# Patient Record
Sex: Female | Born: 1985 | Hispanic: No | Marital: Married | Smoking: Former smoker
Health system: Southern US, Community
[De-identification: ages and names within clinical notes are randomized; demographics above are authoritative.]

## PROBLEM LIST (undated history)

## (undated) DIAGNOSIS — R102 Pelvic and perineal pain: Principal | ICD-10-CM

## (undated) DIAGNOSIS — G43909 Migraine, unspecified, not intractable, without status migrainosus: Secondary | ICD-10-CM

## (undated) DIAGNOSIS — Z309 Encounter for contraceptive management, unspecified: Principal | ICD-10-CM

## (undated) DIAGNOSIS — N739 Female pelvic inflammatory disease, unspecified: Secondary | ICD-10-CM

## (undated) DIAGNOSIS — Z8041 Family history of malignant neoplasm of ovary: Secondary | ICD-10-CM

## (undated) DIAGNOSIS — D649 Anemia, unspecified: Secondary | ICD-10-CM

## (undated) DIAGNOSIS — N920 Excessive and frequent menstruation with regular cycle: Secondary | ICD-10-CM

## (undated) DIAGNOSIS — Z789 Other specified health status: Secondary | ICD-10-CM

## (undated) DIAGNOSIS — N921 Excessive and frequent menstruation with irregular cycle: Secondary | ICD-10-CM

## (undated) DIAGNOSIS — H409 Unspecified glaucoma: Secondary | ICD-10-CM

## (undated) DIAGNOSIS — N946 Dysmenorrhea, unspecified: Secondary | ICD-10-CM

## (undated) HISTORY — DX: Migraine, unspecified, not intractable, without status migrainosus: G43.909

## (undated) HISTORY — DX: Excessive and frequent menstruation with regular cycle: N92.0

## (undated) HISTORY — DX: Female pelvic inflammatory disease, unspecified: N73.9

## (undated) HISTORY — DX: Dysmenorrhea, unspecified: N94.6

## (undated) HISTORY — DX: Family history of malignant neoplasm of ovary: Z80.41

## (undated) HISTORY — DX: Encounter for contraceptive management, unspecified: Z30.9

## (undated) HISTORY — PX: DILATION AND CURETTAGE OF UTERUS: SHX78

## (undated) HISTORY — DX: Excessive and frequent menstruation with irregular cycle: N92.1

## (undated) HISTORY — DX: Pelvic and perineal pain: R10.2

---

## 2012-01-18 LAB — OB RESULTS CONSOLE RPR: RPR: NONREACTIVE

## 2012-01-18 LAB — OB RESULTS CONSOLE HEPATITIS B SURFACE ANTIGEN: Hepatitis B Surface Ag: NEGATIVE

## 2012-01-18 LAB — OB RESULTS CONSOLE GC/CHLAMYDIA: Chlamydia: NEGATIVE

## 2012-01-18 LAB — OB RESULTS CONSOLE HGB/HCT, BLOOD: Hemoglobin: 14.6 g/dL

## 2012-06-11 LAB — OB RESULTS CONSOLE RPR: RPR: NONREACTIVE

## 2012-07-30 ENCOUNTER — Encounter (HOSPITAL_COMMUNITY): Payer: Self-pay | Admitting: *Deleted

## 2012-07-30 ENCOUNTER — Inpatient Hospital Stay (HOSPITAL_COMMUNITY)
Admission: AD | Admit: 2012-07-30 | Discharge: 2012-08-03 | DRG: 765 | Disposition: A | Discharge status: Home / Self Care | Payer: Medicaid Other | Source: Ambulatory Visit | Attending: Obstetrics & Gynecology | Admitting: Obstetrics & Gynecology

## 2012-07-30 DIAGNOSIS — O3090 Multiple gestation, unspecified, unspecified trimester: Principal | ICD-10-CM | POA: Diagnosis present

## 2012-07-30 DIAGNOSIS — O30049 Twin pregnancy, dichorionic/diamniotic, unspecified trimester: Secondary | ICD-10-CM

## 2012-07-30 DIAGNOSIS — O30009 Twin pregnancy, unspecified number of placenta and unspecified number of amniotic sacs, unspecified trimester: Secondary | ICD-10-CM | POA: Diagnosis present

## 2012-07-30 HISTORY — DX: Other specified health status: Z78.9

## 2012-07-30 LAB — CBC
Hemoglobin: 12.5 g/dL (ref 12.0–15.0)
MCH: 29.3 pg (ref 26.0–34.0)
MCHC: 32.5 g/dL (ref 30.0–36.0)
Platelets: 190 10*3/uL (ref 150–400)
RBC: 4.26 MIL/uL (ref 3.87–5.11)

## 2012-07-30 LAB — OB RESULTS CONSOLE GBS: GBS: NEGATIVE

## 2012-07-30 LAB — GROUP B STREP BY PCR: Group B strep by PCR: NEGATIVE

## 2012-07-30 LAB — TYPE AND SCREEN

## 2012-07-30 MED ORDER — LACTATED RINGERS IV BOLUS (SEPSIS)
1000.0000 mL | Freq: Once | INTRAVENOUS | Status: AC
  Administered 2012-07-30: 1000 mL via INTRAVENOUS

## 2012-07-30 MED ORDER — ZOLPIDEM TARTRATE 5 MG PO TABS: 5.0000 mg | ORAL_TABLET | Freq: Every evening | ORAL | Status: DC | PRN

## 2012-07-30 MED ORDER — ACETAMINOPHEN 325 MG PO TABS: 650.0000 mg | ORAL_TABLET | ORAL | Status: DC | PRN

## 2012-07-30 MED ORDER — LACTATED RINGERS IV SOLN
INTRAVENOUS | Status: DC
  Administered 2012-07-30 – 2012-07-31 (×6): via INTRAVENOUS

## 2012-07-30 MED ORDER — DOCUSATE SODIUM 100 MG PO CAPS: 100.0000 mg | ORAL_CAPSULE | Freq: Every day | ORAL | Status: DC

## 2012-07-30 MED ORDER — PRENATAL MULTIVITAMIN CH
1.0000 | ORAL_TABLET | Freq: Every day | ORAL | Status: DC
  Administered 2012-07-30: 1 via ORAL
  Filled 2012-07-30: qty 1

## 2012-07-30 MED ORDER — CALCIUM CARBONATE ANTACID 500 MG PO CHEW: 2.0000 | CHEWABLE_TABLET | ORAL | Status: DC | PRN

## 2012-07-30 NOTE — H&P (Signed)
Arlyn Bumpus is a 26 y.o. female presenting for Contractions and cervical change. Maternal Medical History:  Reason for admission: Reason for admission: contractions.  Reason for Admission:   nauseaFetal activity: Perceived fetal activity is normal.   Last perceived fetal movement was within the past hour.      OB History    Grav Para Term Preterm Abortions TAB SAB Ect Mult Living   6 4 3  1  1   3      Past Medical History  Diagnosis Date  . No pertinent past medical history    Past Surgical History  Procedure Date  . Dilation and curettage of uterus    Family History: family history is not on file. Social History:  reports that she quit smoking about 7 years ago. Her smoking use included Cigarettes. She has never used smokeless tobacco. She reports that she does not drink alcohol. Her drug history not on file.   Prenatal Transfer Tool  Maternal Diabetes: No Genetic Screening:  Maternal Ultrasounds/Referrals: Di/Di Twins Fetal Ultrasounds or other Referrals:  Other:  Maternal Substance Abuse:  No Significant Maternal Medications:  None Significant Maternal Lab Results:  None Other Comments:  Unable to complete. Records pending from prior provider.   Review of Systems  Constitutional: Negative for fever.  Gastrointestinal: Negative for nausea, vomiting, abdominal pain, diarrhea and constipation.  Genitourinary: Negative for dysuria and urgency.  Neurological: Negative for headaches.      Blood pressure 123/77, pulse 87, temperature 98.1 F (36.7 C), temperature source Oral, resp. rate 18, height 5\' 2"  (1.575 m), weight 83.008 kg (183 lb), unknown if currently breastfeeding. Maternal Exam:  Uterine Assessment: Contraction strength is mild.  Contraction duration is 70 seconds. Contraction frequency is regular.   Abdomen: Patient reports no abdominal tenderness. Introitus: Normal vulva. Normal vagina.  Ferning test: not done.  Nitrazine test: not done.  Pelvis:  adequate for delivery.   Cervix: Cervix evaluated by digital exam.     Fetal Exam Fetal Monitor Review: Mode: ultrasound.   Baseline rate: Baby A, 130, Baby B 135.  Variability: moderate (6-25 bpm).   Pattern: accelerations present.    Fetal State Assessment: Category I - tracings are normal.     Physical Exam  Nursing note and vitals reviewed. Constitutional: She is oriented to person, place, and time. She appears well-developed and well-nourished.  Cardiovascular: Normal rate.   Respiratory: Effort normal.  GI: Soft.  Genitourinary:        Cervix: 5-6/90/-2 Baby A vertex.   Neurological: She is alert and oriented to person, place, and time.  Skin: Skin is warm and dry.    Prenatal labs: PENDING ABO, Rh:   Antibody:   Rubella:   RPR:    HBsAg:    HIV:    GBS:   Pending  Assessment/Plan: 33.6 IUP Di/Di twins Pt had BMZ and Mag on 06/08/12 Minimal cervical change Reassuring M-F status Expectant management Aggressive hydration to see if contractions decrease  Records pending from prior provider  Transfer to labor and delivery if active labor ensues Anticipate NSVB   Tawnya Crook 07/30/2012, 9:21 PM

## 2012-07-31 ENCOUNTER — Encounter (HOSPITAL_COMMUNITY)
Admission: AD | Disposition: A | Discharge status: Home / Self Care | Payer: Self-pay | Source: Ambulatory Visit | Attending: Obstetrics & Gynecology

## 2012-07-31 ENCOUNTER — Encounter (HOSPITAL_COMMUNITY): Payer: Self-pay | Admitting: Anesthesiology

## 2012-07-31 ENCOUNTER — Inpatient Hospital Stay (HOSPITAL_COMMUNITY): Payer: Medicaid Other

## 2012-07-31 ENCOUNTER — Inpatient Hospital Stay (HOSPITAL_COMMUNITY): Payer: Medicaid Other | Admitting: Anesthesiology

## 2012-07-31 ENCOUNTER — Encounter (HOSPITAL_COMMUNITY): Payer: Self-pay | Admitting: Neonatology

## 2012-07-31 DIAGNOSIS — O30049 Twin pregnancy, dichorionic/diamniotic, unspecified trimester: Secondary | ICD-10-CM

## 2012-07-31 DIAGNOSIS — O329XX0 Maternal care for malpresentation of fetus, unspecified, not applicable or unspecified: Secondary | ICD-10-CM

## 2012-07-31 DIAGNOSIS — O3090 Multiple gestation, unspecified, unspecified trimester: Secondary | ICD-10-CM

## 2012-07-31 DIAGNOSIS — O30009 Twin pregnancy, unspecified number of placenta and unspecified number of amniotic sacs, unspecified trimester: Secondary | ICD-10-CM

## 2012-07-31 LAB — ABO/RH: ABO/RH(D): O POS

## 2012-07-31 LAB — RPR: RPR Ser Ql: NONREACTIVE

## 2012-07-31 SURGERY — Surgical Case
Anesthesia: Spinal | Wound class: Clean Contaminated

## 2012-07-31 MED ORDER — NALOXONE HCL 0.4 MG/ML IJ SOLN: 0.4000 mg | INTRAMUSCULAR | Status: DC | PRN

## 2012-07-31 MED ORDER — SIMETHICONE 80 MG PO CHEW
80.0000 mg | CHEWABLE_TABLET | ORAL | Status: DC | PRN
  Administered 2012-08-02: 80 mg via ORAL

## 2012-07-31 MED ORDER — DIBUCAINE 1 % RE OINT: 1.0000 "application " | TOPICAL_OINTMENT | RECTAL | Status: DC | PRN

## 2012-07-31 MED ORDER — CITRIC ACID-SODIUM CITRATE 334-500 MG/5ML PO SOLN
ORAL | Status: AC
  Administered 2012-07-31: 30 mL
  Filled 2012-07-31: qty 15

## 2012-07-31 MED ORDER — FENTANYL CITRATE 0.05 MG/ML IJ SOLN: 25.0000 ug | INTRAMUSCULAR | Status: DC | PRN

## 2012-07-31 MED ORDER — KETOROLAC TROMETHAMINE 30 MG/ML IJ SOLN: 30.0000 mg | Freq: Four times a day (QID) | INTRAMUSCULAR | Status: AC | PRN

## 2012-07-31 MED ORDER — IBUPROFEN 600 MG PO TABS
600.0000 mg | ORAL_TABLET | Freq: Four times a day (QID) | ORAL | Status: DC
  Administered 2012-07-31 – 2012-08-03 (×11): 600 mg via ORAL
  Filled 2012-07-31 (×6): qty 1

## 2012-07-31 MED ORDER — LACTATED RINGERS IV SOLN: INTRAVENOUS | Status: DC

## 2012-07-31 MED ORDER — PHENYLEPHRINE HCL 10 MG/ML IJ SOLN
INTRAMUSCULAR | Status: DC | PRN
  Administered 2012-07-31: 40 ug via INTRAVENOUS
  Administered 2012-07-31 (×2): 80 ug via INTRAVENOUS
  Administered 2012-07-31: 40 ug via INTRAVENOUS
  Administered 2012-07-31: 80 ug via INTRAVENOUS

## 2012-07-31 MED ORDER — DIPHENHYDRAMINE HCL 25 MG PO CAPS
25.0000 mg | ORAL_CAPSULE | ORAL | Status: DC | PRN
  Filled 2012-07-31: qty 1

## 2012-07-31 MED ORDER — TETANUS-DIPHTH-ACELL PERTUSSIS 5-2.5-18.5 LF-MCG/0.5 IM SUSP
0.5000 mL | Freq: Once | INTRAMUSCULAR | Status: AC
  Administered 2012-08-01: 0.5 mL via INTRAMUSCULAR
  Filled 2012-07-31: qty 0.5

## 2012-07-31 MED ORDER — PHENYLEPHRINE 40 MCG/ML (10ML) SYRINGE FOR IV PUSH (FOR BLOOD PRESSURE SUPPORT)
PREFILLED_SYRINGE | INTRAVENOUS | Status: AC
  Filled 2012-07-31: qty 5

## 2012-07-31 MED ORDER — OXYTOCIN 10 UNIT/ML IJ SOLN
INTRAMUSCULAR | Status: AC
  Filled 2012-07-31: qty 4

## 2012-07-31 MED ORDER — KETOROLAC TROMETHAMINE 60 MG/2ML IM SOLN
INTRAMUSCULAR | Status: AC
  Filled 2012-07-31: qty 2

## 2012-07-31 MED ORDER — NALBUPHINE HCL 10 MG/ML IJ SOLN
5.0000 mg | INTRAMUSCULAR | Status: DC | PRN
  Filled 2012-07-31: qty 1

## 2012-07-31 MED ORDER — NALBUPHINE HCL 10 MG/ML IJ SOLN: 5.0000 mg | INTRAMUSCULAR | Status: DC | PRN

## 2012-07-31 MED ORDER — SCOPOLAMINE 1 MG/3DAYS TD PT72
1.0000 | MEDICATED_PATCH | Freq: Once | TRANSDERMAL | Status: AC
  Administered 2012-07-31: 1.5 mg via TRANSDERMAL

## 2012-07-31 MED ORDER — OXYCODONE-ACETAMINOPHEN 5-325 MG PO TABS
1.0000 | ORAL_TABLET | ORAL | Status: DC | PRN
  Administered 2012-08-01 (×2): 1 via ORAL
  Filled 2012-07-31 (×4): qty 1

## 2012-07-31 MED ORDER — OXYTOCIN 10 UNIT/ML IJ SOLN
40.0000 [IU] | INTRAVENOUS | Status: DC | PRN
  Administered 2012-07-31: 40 [IU] via INTRAVENOUS

## 2012-07-31 MED ORDER — IBUPROFEN 600 MG PO TABS
600.0000 mg | ORAL_TABLET | Freq: Four times a day (QID) | ORAL | Status: DC | PRN
  Administered 2012-08-02: 600 mg via ORAL
  Filled 2012-07-31 (×6): qty 1

## 2012-07-31 MED ORDER — 0.9 % SODIUM CHLORIDE (POUR BTL) OPTIME
TOPICAL | Status: DC | PRN
  Administered 2012-07-31: 1000 mL

## 2012-07-31 MED ORDER — MEPERIDINE HCL 25 MG/ML IJ SOLN: 6.2500 mg | INTRAMUSCULAR | Status: DC | PRN

## 2012-07-31 MED ORDER — ONDANSETRON HCL 4 MG/2ML IJ SOLN
INTRAMUSCULAR | Status: DC | PRN
  Administered 2012-07-31: 4 mg via INTRAVENOUS

## 2012-07-31 MED ORDER — KETOROLAC TROMETHAMINE 60 MG/2ML IM SOLN
60.0000 mg | Freq: Once | INTRAMUSCULAR | Status: AC | PRN
  Administered 2012-07-31: 60 mg via INTRAMUSCULAR

## 2012-07-31 MED ORDER — NALOXONE HCL 1 MG/ML IJ SOLN: 1.0000 ug/kg/h | INTRAMUSCULAR | Status: DC | PRN

## 2012-07-31 MED ORDER — CEFAZOLIN SODIUM-DEXTROSE 2-3 GM-% IV SOLR
2.0000 g | INTRAVENOUS | Status: AC
  Administered 2012-07-31: 2 mg via INTRAVENOUS
  Filled 2012-07-31: qty 50

## 2012-07-31 MED ORDER — DIPHENHYDRAMINE HCL 50 MG/ML IJ SOLN: 25.0000 mg | INTRAMUSCULAR | Status: DC | PRN

## 2012-07-31 MED ORDER — MORPHINE SULFATE 0.5 MG/ML IJ SOLN
INTRAMUSCULAR | Status: AC
  Filled 2012-07-31: qty 10

## 2012-07-31 MED ORDER — SODIUM CHLORIDE 0.9 % IJ SOLN: 3.0000 mL | INTRAMUSCULAR | Status: DC | PRN

## 2012-07-31 MED ORDER — MORPHINE SULFATE (PF) 0.5 MG/ML IJ SOLN
INTRAMUSCULAR | Status: DC | PRN
  Administered 2012-07-31: .1 mg via INTRATHECAL

## 2012-07-31 MED ORDER — ONDANSETRON HCL 4 MG PO TABS: 4.0000 mg | ORAL_TABLET | ORAL | Status: DC | PRN

## 2012-07-31 MED ORDER — ZOLPIDEM TARTRATE 5 MG PO TABS: 5.0000 mg | ORAL_TABLET | Freq: Every evening | ORAL | Status: DC | PRN

## 2012-07-31 MED ORDER — ONDANSETRON HCL 4 MG/2ML IJ SOLN: 4.0000 mg | INTRAMUSCULAR | Status: DC | PRN

## 2012-07-31 MED ORDER — FENTANYL CITRATE 0.05 MG/ML IJ SOLN
INTRAMUSCULAR | Status: DC | PRN
  Administered 2012-07-31: 15 ug via INTRATHECAL

## 2012-07-31 MED ORDER — PRENATAL MULTIVITAMIN CH
1.0000 | ORAL_TABLET | Freq: Every day | ORAL | Status: DC
  Administered 2012-08-01 – 2012-08-03 (×3): 1 via ORAL
  Filled 2012-07-31 (×3): qty 1

## 2012-07-31 MED ORDER — FENTANYL CITRATE 0.05 MG/ML IJ SOLN
INTRAMUSCULAR | Status: AC
  Filled 2012-07-31: qty 2

## 2012-07-31 MED ORDER — DIPHENHYDRAMINE HCL 25 MG PO CAPS: 25.0000 mg | ORAL_CAPSULE | Freq: Four times a day (QID) | ORAL | Status: DC | PRN

## 2012-07-31 MED ORDER — BUPIVACAINE IN DEXTROSE 0.75-8.25 % IT SOLN
INTRATHECAL | Status: DC | PRN
  Administered 2012-07-31: 10.5 mg via INTRATHECAL

## 2012-07-31 MED ORDER — OXYTOCIN 40 UNITS IN LACTATED RINGERS INFUSION - SIMPLE MED
62.5000 mL/h | INTRAVENOUS | Status: AC
  Administered 2012-07-31: 62.5 mL/h via INTRAVENOUS
  Filled 2012-07-31: qty 1000

## 2012-07-31 MED ORDER — METOCLOPRAMIDE HCL 5 MG/ML IJ SOLN: 10.0000 mg | Freq: Three times a day (TID) | INTRAMUSCULAR | Status: DC | PRN

## 2012-07-31 MED ORDER — DIPHENHYDRAMINE HCL 50 MG/ML IJ SOLN: 12.5000 mg | INTRAMUSCULAR | Status: DC | PRN

## 2012-07-31 MED ORDER — LANOLIN HYDROUS EX OINT: 1.0000 "application " | TOPICAL_OINTMENT | CUTANEOUS | Status: DC | PRN

## 2012-07-31 MED ORDER — SENNOSIDES-DOCUSATE SODIUM 8.6-50 MG PO TABS
2.0000 | ORAL_TABLET | Freq: Every day | ORAL | Status: DC
  Administered 2012-07-31 – 2012-08-02 (×3): 2 via ORAL

## 2012-07-31 MED ORDER — SIMETHICONE 80 MG PO CHEW
80.0000 mg | CHEWABLE_TABLET | Freq: Three times a day (TID) | ORAL | Status: DC
  Administered 2012-07-31 – 2012-08-02 (×8): 80 mg via ORAL

## 2012-07-31 MED ORDER — ONDANSETRON HCL 4 MG/2ML IJ SOLN
INTRAMUSCULAR | Status: AC
  Filled 2012-07-31: qty 2

## 2012-07-31 MED ORDER — EPHEDRINE 5 MG/ML INJ
INTRAVENOUS | Status: AC
  Filled 2012-07-31: qty 10

## 2012-07-31 MED ORDER — SCOPOLAMINE 1 MG/3DAYS TD PT72
MEDICATED_PATCH | TRANSDERMAL | Status: AC
  Filled 2012-07-31: qty 1

## 2012-07-31 MED ORDER — WITCH HAZEL-GLYCERIN EX PADS: 1.0000 "application " | MEDICATED_PAD | CUTANEOUS | Status: DC | PRN

## 2012-07-31 MED ORDER — MENTHOL 3 MG MT LOZG: 1.0000 | LOZENGE | OROMUCOSAL | Status: DC | PRN

## 2012-07-31 MED ORDER — BUPIVACAINE HCL (PF) 0.5 % IJ SOLN
INTRAMUSCULAR | Status: AC
  Filled 2012-07-31: qty 30

## 2012-07-31 MED ORDER — BUPIVACAINE HCL (PF) 0.5 % IJ SOLN
INTRAMUSCULAR | Status: DC | PRN
  Administered 2012-07-31: 30 mL

## 2012-07-31 MED ORDER — ONDANSETRON HCL 4 MG/2ML IJ SOLN: 4.0000 mg | Freq: Three times a day (TID) | INTRAMUSCULAR | Status: DC | PRN

## 2012-07-31 MED ORDER — EPHEDRINE SULFATE 50 MG/ML IJ SOLN
INTRAMUSCULAR | Status: DC | PRN
  Administered 2012-07-31 (×3): 10 mg via INTRAVENOUS

## 2012-07-31 SURGICAL SUPPLY — 44 items
BENZOIN TINCTURE PRP APPL 2/3 (GAUZE/BANDAGES/DRESSINGS) ×2 IMPLANT
BINDER ABD UNIV 10 28-50 (GAUZE/BANDAGES/DRESSINGS) ×1 IMPLANT
BINDER ABD UNIV 12 45-62 (WOUND CARE) IMPLANT
BINDER ABDOM UNIV 10 (GAUZE/BANDAGES/DRESSINGS) ×2
BINDER ABDOMINAL 46IN 62IN (WOUND CARE)
CLOTH BEACON ORANGE TIMEOUT ST (SAFETY) ×2 IMPLANT
DRESSING TELFA 8X3 (GAUZE/BANDAGES/DRESSINGS) ×2 IMPLANT
DRSG COVADERM 4X10 (GAUZE/BANDAGES/DRESSINGS) ×2 IMPLANT
DURAPREP 26ML APPLICATOR (WOUND CARE) ×2 IMPLANT
ELECT REM PT RETURN 9FT ADLT (ELECTROSURGICAL) ×2
ELECTRODE REM PT RTRN 9FT ADLT (ELECTROSURGICAL) ×1 IMPLANT
EXTRACTOR VACUUM M CUP 4 TUBE (SUCTIONS) IMPLANT
GLOVE BIO SURGEON STRL SZ 6.5 (GLOVE) ×2 IMPLANT
GLOVE BIO SURGEON STRL SZ7 (GLOVE) ×2 IMPLANT
GLOVE BIOGEL PI IND STRL 6.5 (GLOVE) ×1 IMPLANT
GLOVE BIOGEL PI IND STRL 7.0 (GLOVE) ×3 IMPLANT
GLOVE BIOGEL PI INDICATOR 6.5 (GLOVE) ×1
GLOVE BIOGEL PI INDICATOR 7.0 (GLOVE) ×3
GLOVE ECLIPSE 6.0 STRL STRAW (GLOVE) ×2 IMPLANT
GOWN PREVENTION PLUS LG XLONG (DISPOSABLE) ×2 IMPLANT
GOWN STRL REIN XL XLG (GOWN DISPOSABLE) ×4 IMPLANT
KIT ABG SYR 3ML LUER SLIP (SYRINGE) ×4 IMPLANT
NEEDLE HYPO 22GX1.5 SAFETY (NEEDLE) ×2 IMPLANT
NEEDLE HYPO 25X5/8 SAFETYGLIDE (NEEDLE) ×4 IMPLANT
NS IRRIG 1000ML POUR BTL (IV SOLUTION) ×2 IMPLANT
PACK C SECTION WH (CUSTOM PROCEDURE TRAY) ×2 IMPLANT
PAD ABD 7.5X8 STRL (GAUZE/BANDAGES/DRESSINGS) ×4 IMPLANT
PAD OB MATERNITY 4.3X12.25 (PERSONAL CARE ITEMS) IMPLANT
RTRCTR C-SECT PINK 25CM LRG (MISCELLANEOUS) IMPLANT
SLEEVE SCD COMPRESS KNEE MED (MISCELLANEOUS) IMPLANT
SPONGE SURGIFOAM ABS GEL 12-7 (HEMOSTASIS) IMPLANT
STAPLER VISISTAT 35W (STAPLE) IMPLANT
STRIP CLOSURE SKIN 1/2X4 (GAUZE/BANDAGES/DRESSINGS) ×2 IMPLANT
SUT PDS AB 0 CTX 60 (SUTURE) IMPLANT
SUT PLAIN 0 NONE (SUTURE) IMPLANT
SUT SILK 0 TIES 10X30 (SUTURE) IMPLANT
SUT VIC AB 0 CT1 36 (SUTURE) ×6 IMPLANT
SUT VIC AB 3-0 CT1 27 (SUTURE) ×1
SUT VIC AB 3-0 CT1 TAPERPNT 27 (SUTURE) ×1 IMPLANT
SUT VIC AB 4-0 KS 27 (SUTURE) IMPLANT
SYR CONTROL 10ML LL (SYRINGE) ×2 IMPLANT
TOWEL OR 17X24 6PK STRL BLUE (TOWEL DISPOSABLE) ×4 IMPLANT
TRAY FOLEY CATH 14FR (SET/KITS/TRAYS/PACK) ×2 IMPLANT
WATER STERILE IRR 1000ML POUR (IV SOLUTION) ×2 IMPLANT

## 2012-07-31 NOTE — Progress Notes (Addendum)
Patient ID: Norma Vaughn, female   DOB: 01/05/1986, 26 y.o.   MRN: [id number] FACULTY PRACTICE ANTEPARTUM(COMPREHENSIVE) NOTE  Kerington Hildebrant is a 26 y.o. [id number] at [redacted]w[redacted]d by early ultrasound who is admitted for Preterm labor.   Fetal presentation is cephalic. Length of Stay:  1  Days  Subjective: Increasing discomfort Patient reports the fetal movement as active. Patient reports uterine contraction  activity as irregular. Patient reports  vaginal bleeding as none. Patient describes fluid per vagina as Other mucus.  Vitals:  Blood pressure 109/65, pulse 80, temperature 98.5 F (36.9 C), temperature source Oral, resp. rate 18, height 5\' 2"  (1.575 m), weight 183 lb (83.008 kg), SpO2 90.00%. Physical Examination:  General appearance - alert, well appearing, and in no distress Heart - normal rate and regular rhythm Abdomen - soft, nontender, nondistended Fundal Height:  consistent with twins Cervical Exam: Not evaluated.  Extremities: extremities normal, atraumatic, no cyanosis or edema and Homans sign is negative, no sign of DVT with DTRs 2+ bilaterally Membranes:intact  Fetal Monitoring:  Baseline: 140, 150 bpm  Labs:  Recent Results (from the past 24 hour(s))  OB RESULTS CONSOLE GBS      Component Value Range   GBS Negative    RPR   Collection Time   07/30/12  8:55 PM      Component Value Range   RPR NON REACTIVE  NON REACTIVE  TYPE AND SCREEN   Collection Time   07/30/12  8:55 PM      Component Value Range   ABO/RH(D) O POS     Antibody Screen NEG     Sample Expiration 08/02/2012    ABO/RH   Collection Time   07/30/12  8:55 PM      Component Value Range   ABO/RH(D) O POS    CBC   Collection Time   07/30/12  8:55 PM      Component Value Range   WBC 11.7 (*) 4.0 - 10.5 K/uL   RBC 4.26  3.87 - 5.11 MIL/uL   Hemoglobin 12.5  12.0 - 15.0 g/dL   HCT 14.7  82.9 - 56.2 %   MCV 90.4  78.0 - 100.0 fL   MCH 29.3  26.0 - 34.0 pg   MCHC 32.5  30.0 - 36.0 g/dL   RDW  13.0  86.5 - 78.4 %   Platelets 190  150 - 400 K/uL  GROUP B STREP BY PCR   Collection Time   07/30/12 10:40 PM      Component Value Range   Group B strep by PCR NEGATIVE  NEGATIVE    Imaging Studies:      Medications:  Scheduled    . docusate sodium  100 mg Oral Daily  . [COMPLETED] lactated ringers  1,000 mL Intravenous Once  . prenatal multivitamin  1 tablet Oral Daily   I have reviewed the patient's current medications.  ASSESSMENT: Twins 34 weeks preterm labor  PLAN: [place] for presentation and growth Observe for signs of active labor  Winefred Hillesheim 07/31/2012,6:31 AM  US shows A breech on right, B cephalic on left Hadassa Cermak 6:96 AM

## 2012-07-31 NOTE — Progress Notes (Signed)
UR completed 

## 2012-07-31 NOTE — Anesthesia Preprocedure Evaluation (Signed)
Anesthesia Evaluation  Patient identified by MRN, date of birth, ID band Patient awake    Reviewed: Allergy & Precautions, H&P , NPO status , Patient's Chart, lab work & pertinent test results, reviewed documented beta blocker date and time   History of Anesthesia Complications Negative for: history of anesthetic complications  Airway Mallampati: I TM Distance: >3 FB Neck ROM: full    Dental  (+) Teeth Intact   Pulmonary former smoker,  breath sounds clear to auscultation        Cardiovascular negative cardio ROS  Rhythm:regular Rate:Normal     Neuro/Psych negative neurological ROS  negative psych ROS   GI/Hepatic negative GI ROS, Neg liver ROS,   Endo/Other  negative endocrine ROS  Renal/GU negative Renal ROS  negative genitourinary   Musculoskeletal   Abdominal   Peds  Hematology negative hematology ROS (+)   Anesthesia Other Findings NPO since 1 am (ate cake)  Reproductive/Obstetrics (+) Pregnancy (twins, PTL (7-8 cm), baby a breech)                           Anesthesia Physical Anesthesia Plan  ASA: II and emergent  Anesthesia Plan: Spinal   Post-op Pain Management:    Induction:   Airway Management Planned:   Additional Equipment:   Intra-op Plan:   Post-operative Plan:   Informed Consent: I have reviewed the patients History and Physical, chart, labs and discussed the procedure including the risks, benefits and alternatives for the proposed anesthesia with the patient or authorized representative who has indicated his/her understanding and acceptance.     Plan Discussed with: Surgeon and CRNA  Anesthesia Plan Comments:         Anesthesia Quick Evaluation

## 2012-07-31 NOTE — OR Nursing (Signed)
1610 Patient to OR via antenatal staff.

## 2012-07-31 NOTE — Anesthesia Postprocedure Evaluation (Signed)
Anesthesia Post Note  Patient: Norma Vaughn  Procedure(s) Performed: Procedure(s) (LRB): CESAREAN SECTION (N/A)  Anesthesia type: Spinal  Patient location: PACU  Post pain: Pain level controlled  Post assessment: Post-op Vital signs reviewed  Last Vitals:  Filed Vitals:   07/31/12 1030  BP:   Pulse:   Temp: 36.3 C  Resp:     Post vital signs: Reviewed  Level of consciousness: awake  Complications: No apparent anesthesia complications

## 2012-07-31 NOTE — Progress Notes (Signed)
Norma Vaughn is a 26 y.o. [phone] at [redacted]w[redacted]d by ultrasound admitted for Preterm labor  Subjective:Increased contractions   Objective: BP 97/59  Pulse 78  Temp 98 F (36.7 C) (Oral)  Resp 20  Ht 5\' 2"  (1.575 m)  Wt 183 lb (83.008 kg)  BMI 33.47 kg/m2  SpO2 100%      FHT:  FHR: 140,150 bpm, variability: moderate,  accelerations:  Present,  decelerations:  Absent UC:   irregular, every 3-5 minutes SVE:   Dilation: 7.5 Effacement (%): 90 Station: -2 Exam by:: Dr Debroah Loop  Labs: Lab Results  Component Value Date   WBC 11.7* 07/30/2012   HGB 12.5 07/30/2012   HCT 38.5 07/30/2012   MCV 90.4 07/30/2012   PLT 190 07/30/2012    Assessment / Plan: Preterm labor with twins  Labor: cervical change, breech Preeclampsia:  no signs or symptoms of toxicity Fetal Wellbeing:  Category I Pain Control:   I/D:  n/a Anticipated MOD:  Cesarean section offered due to breech presentation twin A. Procedure and the risks of bleeding, infection, bowel and urinary tract damage and anesthesia were discussed and questions were answered  ARNOLD,JAMES 07/31/2012, 7:57 AM

## 2012-07-31 NOTE — Anesthesia Procedure Notes (Signed)
Spinal  Patient location during procedure: OR Start time: 07/31/2012 8:33 AM Staffing Performed by: anesthesiologist  Preanesthetic Checklist Completed: patient identified, site marked, surgical consent, pre-op evaluation, timeout performed, IV checked, risks and benefits discussed and monitors and equipment checked Spinal Block Patient position: sitting Prep: site prepped and draped and DuraPrep Patient monitoring: heart rate, cardiac monitor, continuous pulse ox and blood pressure Approach: midline Location: L3-4 Injection technique: single-shot Needle Needle type: Sprotte  Needle gauge: 24 G Needle length: 9 cm Assessment Sensory level: T4 Additional Notes Clear free flow CSF on first attempt.  No paresthesia.  Patient tolerated procedure well.  Jasmine December, MD

## 2012-07-31 NOTE — H&P (Signed)
Present for exam and I agree with note.  ARNOLD,JAMES 07/31/2012

## 2012-07-31 NOTE — Transfer of Care (Signed)
Immediate Anesthesia Transfer of Care Note  Patient: Norma Vaughn  Procedure(s) Performed: Procedure(s) (LRB) with comments: CESAREAN SECTION (N/A) - primary  Patient Location: PACU  Anesthesia Type:Spinal  Level of Consciousness: awake, alert , oriented and patient cooperative  Airway & Oxygen Therapy: Patient Spontanous Breathing  Post-op Assessment: Report given to PACU RN and Post -op Vital signs reviewed and stable  Post vital signs: Reviewed and stable  Complications: No apparent anesthesia complications

## 2012-07-31 NOTE — Progress Notes (Signed)
07/31/12 1600  Clinical Encounter Type  Visited With Patient and family together (Husband Nedra Hai)  Visit Type Initial;Spiritual support;Social support  Referral From Nurse  Spiritual Encounters  Spiritual Needs Emotional    At nurse Randell Patient introduction, made initial visit with Shizuko and husband Nedra Hai.  Panayiota was talkative and open, taking advantage of opportunity to share and process her birth story and stories about her family (three children at home; good support from family and church).  They welcomed pastoral presence and were both calm and grateful.  Temisha especially is eager to see daughters again in NICU.  Provided pastoral listening, reflection, and affirmation.  Let family know of ongoing chaplain availability through babies' NICU stay.  [address] [place], [place] [phone]

## 2012-07-31 NOTE — Op Note (Signed)
Norma Vaughn  PROCEDURE DATE: 07/31/2012   PREOPERATIVE DIAGNOSIS: Intrauterine twin pregnancy at [redacted]w[redacted]d gestation; spontaneous onset of labor, malpresentation: Twin A breech  POSTOPERATIVE DIAGNOSIS: The same  PROCEDURE: Primary Low Transverse Cesarean Section  SURGEON:  Dr. Eber Jones L. Harraway-Smith  ASSISTANT:  Dr. Camillia Herter. Thad Ranger  INDICATIONS: Norma Vaughn is a 26 y.o.  [phone] at [redacted]w[redacted]d who presented with contractions at 5 cm dilation. At the time of admission, Twin A was vertex. However, at  7 cm dilation, Twin A was found to be breech. Patient was therefore scheduled for immediate cesarean section. Please see preoperative note for further details.  The risks of cesarean section were discussed with the patient including but were not limited to: bleeding which may require transfusion or reoperation; infection which may require antibiotics; injury to bowel, bladder, ureters or other surrounding organs; injury to the fetus; need for additional procedures including hysterectomy in the event of a life-threatening hemorrhage; placental abnormalities wth subsequent pregnancies, incisional problems, thromboembolic phenomenon and other postoperative/anesthesia complications.   The patient concurred with the proposed plan, giving informed written consent for the procedure.    FINDINGS:  Twin A:  Viable female infant in breech presentation.  Apgars 8 and 9; weight 4lb 8.7 oz.  Twin B:  Viable female infant in vertex position, APGARs 8 and 9; weight 4 lb, 5.1 oz. Clear amniotic fluid.  Intact placentas, three vessel cords.  Normal uterus, fallopian tubes and ovaries bilaterally.  ANESTHESIA: Spinal INTRAVENOUS FLUIDS: 3400 ml ESTIMATED BLOOD LOSS: 600 ml URINE OUTPUT:  50 ml SPECIMENS: Placenta sent to pathology COMPLICATIONS: None immediate  PROCEDURE IN DETAIL:  The patient preoperatively received intravenous antibiotics and had sequential compression devices applied to her lower  extremities.  She was then taken to the operating room where spinal anesthesia was administered and was found to be adequate. She was then placed in a dorsal supine position with a leftward tilt, and prepped and draped in a sterile manner.  A foley catheter was placed into her bladder and attached to constant gravity.  After an adequate timeout was performed, a Pfannenstiel skin incision was made with scalpel and carried through to the underlying layer of fascia. The fascia was incised in the midline, and this incision was extended bilaterally using the Mayo scissors.  Kocher clamps were applied to the superior aspect of the fascial incision and the underlying rectus muscles were dissected off bluntly. A similar process was carried out on the inferior aspect of the fascial incision. The rectus muscles were separated in the midline bluntly and the peritoneum was entered bluntly. Attention was turned to the lower uterine segment where a low transverse hysterotomy incision was made with a scalpel and extended bilaterally bluntly.  Twin A was successfully delivered, the cord was clamped and cut and the infant was handed over to awaiting neonatology team. Twin B was then delivered successfully, the cord was clamped and cut and the infant handed off to the neonatology team. Uterine massage was then administered, and the placentas were delivered intact with three-vessel cords. The uterus was then cleared of clot and debris.  The hysterotomy was closed with 0 Vicryl in a running locked fashion, and an imbricating layer was also placed with the same suture. The uterus was returned to the pelvis. The pelvis was cleared of all clot and debris. Hemostasis was confirmed on all surfaces.  The peritoneum and the muscles were reapproximated using 0 Vicryl in 1 interrupted suture. The fascia was then closed using  0 Vicryl in a running fashion.  The subcutaneous layer was irrigated, and the skin was closed with a 4-0 Vicryl  subcuticular stitch. 30 ml of 0.5% marcaine were injected into the subcutaneous tissue around the incision following closure. The patient tolerated the procedure well. Sponge, lap, instrument and needle counts were correct x 2.  She was taken to the recovery room in stable condition.

## 2012-08-01 ENCOUNTER — Encounter (HOSPITAL_COMMUNITY): Payer: Self-pay | Admitting: Obstetrics & Gynecology

## 2012-08-01 LAB — CBC
MCH: 29.3 pg (ref 26.0–34.0)
MCHC: 32.6 g/dL (ref 30.0–36.0)
MCV: 90 fL (ref 78.0–100.0)
Platelets: 168 10*3/uL (ref 150–400)
RDW: 14 % (ref 11.5–15.5)

## 2012-08-01 NOTE — Addendum Note (Signed)
Addendum  created 08/01/12 1340 by Earmon Phoenix, CRNA   Modules edited:Notes Section

## 2012-08-01 NOTE — Anesthesia Postprocedure Evaluation (Signed)
  Anesthesia Post-op Note  Patient: Norma Vaughn  Procedure(s) Performed: Procedure(s) (LRB) with comments: CESAREAN SECTION (N/A) - primary  Patient Location: [facility] Unit  Anesthesia Type:Spinal  Level of Consciousness: awake, alert  and oriented  Airway and Oxygen Therapy: Patient Spontanous Breathing  Post-op Pain: mild  Post-op Assessment: Patient's Cardiovascular Status Stable, Respiratory Function Stable, No headache, No backache, No residual numbness and No residual motor weakness  Post-op Vital Signs: stable  Complications: No apparent anesthesia complications

## 2012-08-01 NOTE — Progress Notes (Signed)
08/01/12 1100  Clinical Encounter Type  Visited With Patient and family together (With husband in NICU)  Visit Type Follow-up;Spiritual support;Social support  Spiritual Encounters  Spiritual Needs Emotional  Stress Factors  Patient Stress Factors Loss of control (Norma Vaughn especially is worried about [name])    Followed up with Norma Vaughn and her husband as she held baby Angelica in the NICU while [name] received care from nurse and specialists.  Norma Vaughn was sad, worried, and exhausted after Melody's setbacks last night and brief, poor sleep.  Husband may need to return to work tomorrow, so Engineer, agricultural and I plan to follow up when she may need extra support.  Provided pastoral listening, emotional support, and witness to their unfolding family story.  [address] [place], [place] [phone]

## 2012-08-02 NOTE — Progress Notes (Signed)
Subjective: Postpartum Day 2: Cesarean Delivery Patient reports incisional pain, tolerating PO, + flatus, + BM and no problems voiding.    Objective: Vital signs in last 24 hours: Temp:  [97.8 F (36.6 C)-98.5 F (36.9 C)] 97.8 F (36.6 C) (12/05 0553) Pulse Rate:  [65-72] 65  (12/05 0553) Resp:  [18] 18  (12/05 0553) BP: (93-107)/(50-54) 93/53 mmHg (12/05 0553) SpO2:  [99 %-100 %] 99 % (12/05 0553)  Physical Exam:  Filed Vitals:   08/02/12 0553  BP: 93/53  Pulse: 65  Temp: 97.8 F (36.6 C)  Resp: 18   General: alert, cooperative and appears stated age CVS:  RRR, without murmur, gallops, or rubs Lungs:  CTA bilat ABD:  +BSx4, normal Lochia: appropriate Uterine Fundus: firm Incision: no significant drainage; incision without signs of infection (no redness, no abnormal discharge, no odor) DVT Evaluation: No evidence of DVT seen on physical exam. Negative Homan's sign.  Basename 08/01/12 0525 07/30/12 2055  HGB 11.4* 12.5  HCT 35.0* 38.5    Assessment/Plan: Status post Cesarean section. Doing well postoperatively.  Continue current care.  [facility] 08/02/2012, 9:48 AM

## 2012-08-03 MED ORDER — OXYCODONE-ACETAMINOPHEN 5-325 MG PO TABS: 1.0000 | ORAL_TABLET | ORAL | Status: DC | PRN

## 2012-08-03 MED ORDER — IBUPROFEN 600 MG PO TABS: 600.0000 mg | ORAL_TABLET | Freq: Four times a day (QID) | ORAL | Status: DC | PRN

## 2012-08-03 NOTE — Progress Notes (Signed)
Discharge instructions provided to patient and significant other at bedside. No questions at this time.  patient discharged home with prescriptions and personal belongings.  Patient left unit in stable condition.  Osvaldo Angst, RN-----------

## 2012-08-03 NOTE — Progress Notes (Signed)
I was referred to pt by Caro Hight who saw her earlier in the week.  Norma Vaughn is concerned over [name]'s conditions, but she continues to take things day by day.  She is working with social work to try to find a way to stay closer to the hospital so that she can visit them more easily and so that she can get here quickly if her babies' conditions change.  She lives with her husband and her other  3 children about 90 minutes from here.    I provided compassionate listening as she told her story and helped her to think through her resources for support during this difficult time.  We will continue to follow this family in the NICU, but please page as needs arise.  [employer] Pager, [phone] 10:41 AM   08/03/12 1000  Clinical Encounter Type  Visited With Patient  Visit Type Follow-up;Spiritual support  Referral From Chaplain  Stress Factors  Patient Stress Factors (Babies in NICU.)

## 2012-08-03 NOTE — Clinical Social Work Psychosocial (Signed)
Clinical Social Work Department PSYCHOSOCIAL ASSESSMENT - MATERNAL/CHILD 08/03/2012  Patient:  Norma Vaughn, Norma Vaughn  Account Number:  [id number]  Admit Date:  07/31/2012  Marjo Bicker Name:   Geri Seminole    Clinical Social Worker:  Vennie Homans, [place]   Date/Time:  08/02/2012 11:00 AM  Date Referred:  08/02/2012   Referral source  NICU     Referred reason  NICU   Other referral source:    I:  FAMILY / HOME ENVIRONMENT Child's legal guardian:  PARENT  Guardian - Name Guardian - Age Guardian - Address  Norma Vaughn 26 [address] [place], [place]  Norma Vaughn  [address] [place], [place]   Other household support members/support persons Name Relationship DOB  Patience Yauco SISTER 6yo  Harmony Blanks SISTER 4yo  Vicente Serene BROTHER 20 months  Selena Lesser IUFD at 25 weeks, 2010   Other support:   Extensive network of very large extended family    II  PSYCHOSOCIAL DATA Information Source:  Family Interview  Surveyor, quantity and [employer] Employment:   MOB stays at home with children  FOB is a Curator and is currently working in [place], [place] Monday-Friday.   Financial resources:  Medicaid If [employer] - County:   Other  [facility]   School / Grade:   Maternity Care Coordinator / Child Services Coordination / Early Interventions:   MOB reports she has [facility] in [facility] where she resides.  Cultural issues impacting care:   None noted    III  STRENGTHS Strengths  Compliance with medical plan  Adequate Resources  Supportive family/friends   Strength comment:  MOB is open to assistance and not afraid to ask for help when needed. VERY large family at home that is helping with siblings.   IV  RISK FACTORS AND CURRENT PROBLEMS Current Problem:  YES   Risk Factor & Current Problem Patient Issue Family Issue Risk Factor / Current Problem Comment  Adjustment to Illness N Y Family stressed about Baby A, Melody  TRANSPORTATION N Y  Distance and gas may be an issue.  Financial Resources N Y Finances are tight.    V  SOCIAL WORK ASSESSMENT CSW met with MOB in her room to complete assessment. She seemed in good spirits currently, but shared she was concerned for the twins. MOB has a huge family support network to assist her. Family is helping with her other children and can assist with transportation to visit. Visiting is a concern, as family lives about an hour and a half a way. Dad works as a Curator, but has had to work out of town for his company. This has been a stressor as well on MOB and children.  Family appears linked with resources in their local county, but finances appear to be tight.      VI SOCIAL WORK PLAN Social Work Plan  Psychosocial Support/Ongoing Assessment of Needs  Information/Referral to [employer]   Type of pt/family education:   If child protective services report - county:   If child protective services report - date:   Information/referral to community resources comment:   May need gas assistance and help with baby items.  Possible [employer] referral   Other social work plan:   CSW offered supportive listening and discussed coping techniques. CSW shared local resources to assist family. CSW looked into [facility] for family, but no rooms are available. Being able to visit regularly is biggest concern to family due to distance and small children  at home. CSW will follow closely and help with resources.  MOB very appreciative.     Doreen Salvage, LCSW  Coverning NICU for [facility]iding M-F 8am-12pm

## 2012-08-03 NOTE — Discharge Summary (Signed)
Attestation of Attending Supervision of Advanced Practitioner (CNM/NP): Evaluation and management procedures were performed by the Advanced Practitioner under my supervision and collaboration.  I have reviewed the Advanced Practitioner's note and chart, and I agree with the management and plan.  Trenna Kiely, MD, FACOG Attending Obstetrician & Gynecologist Faculty Practice, [facility]  

## 2012-08-03 NOTE — Discharge Summary (Signed)
Norma Vaughn, Norma Vaughn Range Status  08/01/2012 35.0* 36.0 - 46.0 % Final  Hospital Course: [place] Pelto is a 26 y.o. [id number] at [redacted]w[redacted]d who presented with contractions at 5 cm dilation. At the time of admission, Twin A was vertex. However, at 7 cm dilation, Twin A was found to be breech. Patient was therefore scheduled for immediate cesarean section. She underwent a LTCS with delivery of twin females who were taken to the NICU.  They have done well, but one is requiring more support than the other.  Parents are coping well and have support.  Physical Exam:  General: alert, cooperative and no distress Lochia: appropriate Uterine Fundus: firm Incision: healing well, no significant drainage DVT Evaluation: No evidence of DVT seen on physical exam.  Discharge Diagnoses: Premature labor and Cesarean Delivery of twin girls  Discharge Information: Date: 08/03/2012 Activity: No driving for 2 wks, pelvic rest, activity as tolerated Diet: routine Medications: PNV, Ibuprofen and Percocet Condition: stable and improved Instructions: refer to practice specific booklet Discharge to: home Follow-up Information    Follow up with [facility].   Contact information:   [address] [place] [address] [phone]       in one week.  Newborn Data:   Tinya, Cadogan [[id number]]  Live born female  Birth Weight: 4 lb 8.7 oz (2060 g) APGAR: 8, [address]   Rifky, Lapre [[id number]]  Live born female  Birth Weight: 4 lb 5.1 oz (1960 g) APGAR: 8, 9  Infants both in NICU .  [facility] 08/03/2012, 10:17 AM

## 2012-08-08 NOTE — Op Note (Signed)
I was scrubbed for entire procedure.  There were no immediate complications.  Takiah Maiden L. Harraway-Smith, M.D., Evern Core

## 2013-05-01 ENCOUNTER — Telehealth: Payer: Self-pay | Admitting: Obstetrics & Gynecology

## 2013-05-01 NOTE — Telephone Encounter (Signed)
Spoke with pt. Had twins in 07/2012 by C section. No period while breastfeeding, but she is not breastfeeding now and period is late. Pt states home pregnancy test is negative but she has been pregnant before with home pregnancy test showing negative. Pt states also having pain, wonders if she has a cyst on ovary. Appt scheduled and will do pregnancy test and evaluate pain. JSY

## 2013-05-09 ENCOUNTER — Ambulatory Visit: Payer: Self-pay | Admitting: Adult Health

## 2013-09-16 ENCOUNTER — Other Ambulatory Visit: Payer: Self-pay | Admitting: Obstetrics & Gynecology

## 2013-09-16 DIAGNOSIS — O09299 Supervision of pregnancy with other poor reproductive or obstetric history, unspecified trimester: Secondary | ICD-10-CM

## 2013-09-16 DIAGNOSIS — O34219 Maternal care for unspecified type scar from previous cesarean delivery: Secondary | ICD-10-CM

## 2013-09-16 DIAGNOSIS — O3680X0 Pregnancy with inconclusive fetal viability, not applicable or unspecified: Secondary | ICD-10-CM

## 2013-09-17 ENCOUNTER — Other Ambulatory Visit: Payer: Self-pay | Admitting: Obstetrics & Gynecology

## 2013-09-17 ENCOUNTER — Ambulatory Visit (INDEPENDENT_AMBULATORY_CARE_PROVIDER_SITE_OTHER): Payer: Medicaid Other

## 2013-09-17 ENCOUNTER — Encounter (INDEPENDENT_AMBULATORY_CARE_PROVIDER_SITE_OTHER): Payer: Self-pay

## 2013-09-17 ENCOUNTER — Encounter: Payer: Self-pay | Admitting: Obstetrics & Gynecology

## 2013-09-17 DIAGNOSIS — O09299 Supervision of pregnancy with other poor reproductive or obstetric history, unspecified trimester: Secondary | ICD-10-CM

## 2013-09-17 DIAGNOSIS — O26849 Uterine size-date discrepancy, unspecified trimester: Secondary | ICD-10-CM

## 2013-09-17 DIAGNOSIS — O34219 Maternal care for unspecified type scar from previous cesarean delivery: Secondary | ICD-10-CM

## 2013-09-17 DIAGNOSIS — O3680X0 Pregnancy with inconclusive fetal viability, not applicable or unspecified: Secondary | ICD-10-CM

## 2013-09-17 NOTE — Progress Notes (Signed)
U/S-transvaginal u/s performed, single GS noted with +YS noted, GS meas c/w 5+4 wks no embryo noted yet, cx appears closed, will repeat u/s for viability

## 2013-09-20 ENCOUNTER — Encounter: Payer: Self-pay | Admitting: Obstetrics and Gynecology

## 2013-09-27 ENCOUNTER — Other Ambulatory Visit: Payer: Self-pay | Admitting: Obstetrics & Gynecology

## 2013-09-27 DIAGNOSIS — O3680X0 Pregnancy with inconclusive fetal viability, not applicable or unspecified: Secondary | ICD-10-CM

## 2013-09-30 ENCOUNTER — Encounter: Payer: Self-pay | Admitting: Women's Health

## 2013-09-30 ENCOUNTER — Ambulatory Visit (INDEPENDENT_AMBULATORY_CARE_PROVIDER_SITE_OTHER): Payer: Medicaid Other | Admitting: Women's Health

## 2013-09-30 ENCOUNTER — Ambulatory Visit (INDEPENDENT_AMBULATORY_CARE_PROVIDER_SITE_OTHER): Payer: Medicaid Other

## 2013-09-30 ENCOUNTER — Other Ambulatory Visit: Payer: Self-pay | Admitting: Obstetrics & Gynecology

## 2013-09-30 DIAGNOSIS — O200 Threatened abortion: Secondary | ICD-10-CM

## 2013-09-30 DIAGNOSIS — O3680X0 Pregnancy with inconclusive fetal viability, not applicable or unspecified: Secondary | ICD-10-CM

## 2013-09-30 NOTE — Progress Notes (Addendum)
   [facility] Visit  Patient name: Norma BucksDanielle Meader MRN [id number]  Date of birth: [birth year]  CC & HPI:  Norma Vaughn is a 28 y.o. Caucasian [id number] female at 2329w3d by LMP originally scheduled today for initial pnv. However, she has been having spotting and cramping x ~2wks, heavier bleeding on 3d ago, back to spotting next day, then heavier again yesterday. She had an u/s 2wks ago w/ single GS measuring 4931w4d, w/ +YS, but no embryo yet. Has had 2 previous sab's, fairly certain she is miscarrying. She is accompanied today by her husband and set of 6081yr old twins.    OB History  Gravida Para Term Preterm AB SAB TAB Ectopic Multiple Living  8 5 3 2 2 2   1 5     # Outcome Date GA Lbr Len/2nd Weight Sex Delivery Anes PTL Lv  8 CUR           7 SAB 05/2013          6A PRE 07/31/12 3349w0d  4 lb 8.7 oz (2.06 kg) F LTCS Spinal  Y  6B  07/31/12 349w0d   F LTCS Spinal  Y  5 TRM 11/17/10    M SVD   Y  4 SAB 02/11/10 6844w0d            Comments: twins  3 PRE 06/26/09 53102w5d   M SVD   SB  2 TRM 11/02/07    F SVD   Y  1 TRM 12/04/05    F SVD   Y      Pertinent History Reviewed:  Medical & Surgical Hx:   Past Medical History  Diagnosis Date  . No pertinent past medical history    Past Surgical History  Procedure Laterality Date  . Dilation and curettage of uterus    . Cesarean section  07/31/2012    Procedure: CESAREAN SECTION;  Surgeon: Willodean Rosenthalarolyn Harraway-Smith, MD;  Location: [facility];  Service: Gynecology;  Laterality: N/A;  primary   Medications: Reviewed & Updated - see associated section Social History: Reviewed -  reports that she quit smoking about 8 years ago. Her smoking use included Cigarettes. She smoked 0.00 packs per day. She has never used smokeless tobacco.  Objective Findings:  Vitals: LMP 07/31/2013  Physical Examination: General appearance - alert, well-appearing, sad  Today's u/s:  single Intrauterine Gestational Sac noted, GS meas c/w 6+2wks, no embryo noted on  today's exam, cx appears closed, bilateral adnexa appears wnl, GS measurement=15.542mm today  Assessment & Plan:  A:   Threatened miscarriage @ 6729w3d  P:  BHCG, group & type, progesterone today, repeat bhcg in 2d  Repeat u/s 1wk w/ f/u appt with me   Reviewed warning s/s to report  Printed info on threatened miscarriage given  Marge DuncansBooker, Karam Dunson Randall CNM, [employer]NP-BC 09/30/2013 5:05 PM

## 2013-09-30 NOTE — Patient Instructions (Signed)
Threatened Miscarriage  Bleeding during the first 20 weeks of pregnancy is common. This is sometimes called a threatened miscarriage. This is a pregnancy that is threatening to end before the twentieth week of pregnancy. Often this bleeding stops with bed rest or decreased activities as suggested by your caregiver and the pregnancy continues without any more problems. You may be asked to not have sexual intercourse, have orgasms or use tampons until further notice. Sometimes a threatened miscarriage can progress to a complete or incomplete miscarriage. This may or may not require further treatment. Some miscarriages occur before a woman misses a menstrual period and knows she is pregnant.  Miscarriages occur in 15 to 20% of all pregnancies and usually occur during the first 13 weeks of the pregnancy. The exact cause of a miscarriage is usually never known. A miscarriage is natures way of ending a pregnancy that is abnormal or would not make it to term. There are some things that may put you at risk to have a miscarriage, such as:  · Hormone problems.  · Infection of the uterus or cervix.  · Chronic illness, diabetes for example, especially if it is not controlled.  · Abnormal shaped uterus.  · Fibroids in the uterus.  · Incompetent cervix (the cervix is too weak to hold the baby).  · Smoking.  · Drinking too much alcohol. It's best not to drink any alcohol when you are pregnant.  · Taking illegal drugs.  TREATMENT   When a miscarriage becomes complete and all products of conception (all the tissue in the uterus) have been passed, often no treatment is needed. If you think you passed tissue, save it in a container and take it to your doctor for evaluation. If the miscarriage is incomplete (parts of the fetus or placenta remain in the uterus), further treatment may be needed. The most common reason for further treatment is continued bleeding (hemorrhage) because pregnancy tissue did not pass out of the uterus. This  often occurs if a miscarriage is incomplete. Tissue left behind may also become infected. Treatment usually is dilatation and curettage (the removal of the remaining products of pregnancy. This can be done by a simple sucking procedure (suction curettage) or a simple scraping of the inside of the uterus. This may be done in the hospital or in the caregiver's office. This is only done when your caregiver knows that there is no chance for the pregnancy to proceed to term. This is determined by physical examination, negative pregnancy test, falling pregnancy hormone count and/or, an ultrasound revealing a dead fetus.  Miscarriages are often a very emotional time for prospective mothers and fathers. This is not you or your partners fault. It did not occur because of an inadequacy in you or your partner. Nearly all miscarriages occur because the pregnancy has started off wrongly. At least half of these pregnancies have a chromosomal abnormality. It is almost always not inherited. Others may have developmental problems with the fetus or placenta. This does not always show up even when the products miscarried are studied under the microscope. The miscarriage is nearly always not your fault and it is not likely that you could have prevented it from happening. If you are having emotional and grieving problems, talk to your health care provider and even seek counseling, if necessary, before getting pregnant again. You can begin trying for another pregnancy as soon as your caregiver says it is OK.  HOME CARE INSTRUCTIONS   · Your caregiver may order   bed rest depending on how much bleeding and cramping you are having. You may be limited to only getting up to go to the bathroom. You may be allowed to continue light activity. You may need to make arrangements for the care of your other children and for any other responsibilities.  · Keep track of the number of pads you use each day, how often you have to change pads and how  saturated (soaked) they are. Record this information.  · DO NOT USE TAMPONS. Do not douche, have sexual intercourse or orgasms until approved by your caregiver.  · You may receive a follow up appointment for re-evaluation of your pregnancy and a repeat blood test. Re-evaluation often occurs after 2 days and again in 4 to 6 weeks. It is very important that you follow-up in the recommended time period.  · If you are Rh negative and the father is Rh positive or you do not know the fathers' blood type, you may receive a shot (Rh immune globulin) to help prevent abnormal antibodies that can develop and affect the baby in any future pregnancies.  SEEK IMMEDIATE MEDICAL CARE IF:  · You have severe cramps in your stomach, back, or abdomen.  · You have a sudden onset of severe pain in the lower part of your abdomen.  · You develop chills.  · You run an unexplained temperature of 101° F (38.3° C) or higher.  · You pass large clots or tissue. Save any tissue for your caregiver to inspect.  · Your bleeding increases or you become light-headed, weak, or have fainting episodes.  · You have a gush of fluid from your vagina.  · You pass out. This could mean you have a tubal (ectopic) pregnancy.  Document Released: 08/15/2005 Document Revised: 11/07/2011 Document Reviewed: 03/31/2008  ExitCare® Patient Information ©2014 [employer].

## 2013-09-30 NOTE — Progress Notes (Signed)
U/S(7+3wks)-single Intrauterine Gestational Sac noted, GS meas c/w 6+2wks, no embryo noted on today's exam, cx appears closed, bilateral adnexa appears wnl, GS measurement=15.142mm today

## 2013-10-01 LAB — ABO AND RH: RH TYPE: POSITIVE

## 2013-10-01 LAB — PROGESTERONE: PROGESTERONE: 4.6 ng/mL

## 2013-10-01 LAB — HCG, QUANTITATIVE, PREGNANCY: HCG, BETA CHAIN, QUANT, S: 15893 m[IU]/mL

## 2013-10-02 ENCOUNTER — Other Ambulatory Visit: Payer: Self-pay

## 2013-10-03 ENCOUNTER — Ambulatory Visit: Payer: Self-pay | Admitting: Advanced Practice Midwife

## 2013-10-03 ENCOUNTER — Other Ambulatory Visit: Payer: Self-pay | Admitting: Obstetrics & Gynecology

## 2013-10-03 DIAGNOSIS — O200 Threatened abortion: Secondary | ICD-10-CM

## 2013-10-07 ENCOUNTER — Telehealth: Payer: Self-pay | Admitting: *Deleted

## 2013-10-07 ENCOUNTER — Ambulatory Visit: Payer: Self-pay | Admitting: Women's Health

## 2013-10-07 ENCOUNTER — Other Ambulatory Visit: Payer: Self-pay

## 2013-10-07 NOTE — Telephone Encounter (Signed)
Pt states saw Joellyn HaffKim Booker, CNM last week, unable to find heartbeat, threatened missed AB. Now passing tissue and blood clots with cramping, cramping relieved with ibuprofen. Offered pt an appt today but states unable to come due to child in hospital. Pt has an appt for this Thursday. Pt encouraged to call office back with increase pain. Pt verbalized understanding.

## 2013-10-10 ENCOUNTER — Other Ambulatory Visit: Payer: Self-pay

## 2013-10-10 ENCOUNTER — Ambulatory Visit: Payer: Self-pay | Admitting: Advanced Practice Midwife

## 2013-10-23 ENCOUNTER — Ambulatory Visit: Payer: Self-pay | Admitting: Advanced Practice Midwife

## 2013-10-23 ENCOUNTER — Other Ambulatory Visit: Payer: Self-pay

## 2013-10-31 ENCOUNTER — Other Ambulatory Visit: Payer: Self-pay | Admitting: Adult Health

## 2013-10-31 ENCOUNTER — Other Ambulatory Visit: Payer: Self-pay | Admitting: Obstetrics & Gynecology

## 2013-10-31 ENCOUNTER — Encounter: Payer: Self-pay | Admitting: Adult Health

## 2013-10-31 ENCOUNTER — Ambulatory Visit (INDEPENDENT_AMBULATORY_CARE_PROVIDER_SITE_OTHER): Payer: Medicaid Other | Admitting: Adult Health

## 2013-10-31 ENCOUNTER — Ambulatory Visit (INDEPENDENT_AMBULATORY_CARE_PROVIDER_SITE_OTHER): Payer: Medicaid Other

## 2013-10-31 VITALS — BP 98/50 | Ht 62.0 in | Wt 150.0 lb

## 2013-10-31 DIAGNOSIS — Z8759 Personal history of other complications of pregnancy, childbirth and the puerperium: Secondary | ICD-10-CM

## 2013-10-31 DIAGNOSIS — O039 Complete or unspecified spontaneous abortion without complication: Secondary | ICD-10-CM

## 2013-10-31 DIAGNOSIS — N949 Unspecified condition associated with female genital organs and menstrual cycle: Secondary | ICD-10-CM

## 2013-10-31 DIAGNOSIS — Z8742 Personal history of other diseases of the female genital tract: Secondary | ICD-10-CM

## 2013-10-31 DIAGNOSIS — O200 Threatened abortion: Secondary | ICD-10-CM

## 2013-10-31 NOTE — Progress Notes (Signed)
Subjective:     Patient ID: Norma Vaughn, female   DOB: March 29, 1986, 28 y.o.   MRN: [id number]  HPI Norma Vaughn is a 28 year old white female in for US, she had bleeding and passed tissue 2/6-2/9,  she brought it  in today.She bleed til last week, none now.  Review of Systems See HPI Reviewed past medical,surgical, social and family history. Reviewed medications and allergies.     Objective:   Physical Exam BP 98/50  Ht 5\' 2"  (1.575 m)  Wt 150 lb (68.04 kg)  BMI 27.43 kg/m2  LMP 07/31/2013  Breastfeeding? No    US showed endometrium of 7.7 no sac seen,miscarrriage completed, blood type 0+ Discussed birth control, pr not sure what she wants, but does not want more children.  Assessment:     Miscarriage    Plan:     [facility] in 1 week to discuss options of birth control Review handouts on nexplanon, tubal ligation and endo ablation

## 2013-10-31 NOTE — Patient Instructions (Signed)
Miscarriage A miscarriage is the sudden loss of an unborn baby (fetus) before the 20th week of pregnancy. Most miscarriages happen in the first 3 months of pregnancy. Sometimes, it happens before a woman even knows she is pregnant. A miscarriage is also called a "spontaneous miscarriage" or "early pregnancy loss." Having a miscarriage can be an emotional experience. Talk with your caregiver about any questions you may have about miscarrying, the grieving process, and your future pregnancy plans. CAUSES   Problems with the fetal chromosomes that make it impossible for the baby to develop normally. Problems with the baby's genes or chromosomes are most often the result of errors that occur, by chance, as the embryo divides and grows. The problems are not inherited from the parents.  Infection of the cervix or uterus.   Hormone problems.   Problems with the cervix, such as having an incompetent cervix. This is when the tissue in the cervix is not strong enough to hold the pregnancy.   Problems with the uterus, such as an abnormally shaped uterus, uterine fibroids, or congenital abnormalities.   Certain medical conditions.   Smoking, drinking alcohol, or taking illegal drugs.   Trauma.  Often, the cause of a miscarriage is unknown.  SYMPTOMS   Vaginal bleeding or spotting, with or without cramps or pain.  Pain or cramping in the abdomen or lower back.  Passing fluid, tissue, or blood clots from the vagina. DIAGNOSIS  Your caregiver will perform a physical exam. You may also have an ultrasound to confirm the miscarriage. Blood or urine tests may also be ordered. TREATMENT   Sometimes, treatment is not necessary if you naturally pass all the fetal tissue that was in the uterus. If some of the fetus or placenta remains in the body (incomplete miscarriage), tissue left behind may become infected and must be removed. Usually, a dilation and curettage (D and C) procedure is performed.  During a D and C procedure, the cervix is widened (dilated) and any remaining fetal or placental tissue is gently removed from the uterus.  Antibiotic medicines are prescribed if there is an infection. Other medicines may be given to reduce the size of the uterus (contract) if there is a lot of bleeding.  If you have Rh negative blood and your baby was Rh positive, you will need a Rh immunoglobulin shot. This shot will protect any future baby from having Rh blood problems in future pregnancies. HOME CARE INSTRUCTIONS   Your caregiver may order bed rest or may allow you to continue light activity. Resume activity as directed by your caregiver.  Have someone help with home and family responsibilities during this time.   Keep track of the number of sanitary pads you use each day and how soaked (saturated) they are. Write down this information.   Do not use tampons. Do not douche or have sexual intercourse until approved by your caregiver.   Only take over-the-counter or prescription medicines for pain or discomfort as directed by your caregiver.   Do not take aspirin. Aspirin can cause bleeding.   Keep all follow-up appointments with your caregiver.   If you or your partner have problems with grieving, talk to your caregiver or seek counseling to help cope with the pregnancy loss. Allow enough time to grieve before trying to get pregnant again.  SEEK IMMEDIATE MEDICAL CARE IF:   You have severe cramps or pain in your back or abdomen.  You have a fever.  You pass large blood clots (walnut-sized   or larger) ortissue from your vagina. Save any tissue for your caregiver to inspect.   Your bleeding increases.   You have a thick, bad-smelling vaginal discharge.  You become lightheaded, weak, or you faint.   You have chills.  MAKE SURE YOU:  Understand these instructions.  Will watch your condition.  Will get help right away if you are not doing well or get  worse. Document Released: 02/08/2001 Document Revised: 12/10/2012 Document Reviewed: 10/04/2011 [facility] Patient Information 2014 [employer], [place]. Return in 1 week

## 2013-11-01 ENCOUNTER — Telehealth: Payer: Self-pay | Admitting: Adult Health

## 2013-11-01 LAB — HCG, QUANTITATIVE, PREGNANCY

## 2013-11-01 NOTE — Telephone Encounter (Signed)
Number not in service.

## 2013-11-07 ENCOUNTER — Ambulatory Visit (INDEPENDENT_AMBULATORY_CARE_PROVIDER_SITE_OTHER): Payer: Medicaid Other | Admitting: Adult Health

## 2013-11-07 ENCOUNTER — Encounter: Payer: Self-pay | Admitting: Adult Health

## 2013-11-07 VITALS — BP 100/60 | Ht 62.5 in | Wt 150.0 lb

## 2013-11-07 DIAGNOSIS — Z3049 Encounter for surveillance of other contraceptives: Secondary | ICD-10-CM

## 2013-11-07 DIAGNOSIS — Z309 Encounter for contraceptive management, unspecified: Secondary | ICD-10-CM

## 2013-11-07 HISTORY — DX: Encounter for contraceptive management, unspecified: Z30.9

## 2013-11-07 MED ORDER — ETONOGESTREL-ETHINYL ESTRADIOL 0.12-0.015 MG/24HR VA RING: VAGINAL_RING | VAGINAL | Status: DC

## 2013-11-07 NOTE — Progress Notes (Signed)
Subjective:     Patient ID: Norma Vaughn, female   DOB: Dec 17, 1985, 28 y.o.   MRN: [id number]  HPI Norma Vaughn is back to discuss birth control, she and husband reviewed the handouts, and she wants to try nuva ring, has used before.  Review of Systems See HPI Reviewed past medical,surgical, social and family history. Reviewed medications and allergies.     Objective:   Physical Exam BP 100/60  Ht 5' 2.5" (1.588 m)  Wt 150 lb (68.04 kg)  BMI 26.98 kg/m2  LMP 07/31/2013  Breastfeeding? No   Talk only, wants to try nuva ring, has had sex with condom, pt aware that tissue passed was POC.  Assessment:     Contraceptive management Sp miscarriage    Plan:     Rx nuva ring disp 1 ring with 11 refills Put ring in with period Use condoms Return 3/26 for pap Then in 1 year Review handout on nuva ring

## 2013-11-07 NOTE — Patient Instructions (Addendum)

## 2013-11-19 ENCOUNTER — Other Ambulatory Visit: Payer: Medicaid Other | Admitting: Adult Health

## 2013-11-21 ENCOUNTER — Encounter: Payer: Self-pay | Admitting: Adult Health

## 2013-11-21 ENCOUNTER — Other Ambulatory Visit (HOSPITAL_COMMUNITY)
Admission: RE | Admit: 2013-11-21 | Discharge: 2013-11-21 | Disposition: A | Discharge status: Home / Self Care | Payer: Medicaid Other | Source: Ambulatory Visit | Attending: Obstetrics and Gynecology | Admitting: Obstetrics and Gynecology

## 2013-11-21 ENCOUNTER — Ambulatory Visit (INDEPENDENT_AMBULATORY_CARE_PROVIDER_SITE_OTHER): Payer: Medicaid Other | Admitting: Adult Health

## 2013-11-21 VITALS — BP 118/76 | HR 80 | Ht 62.5 in | Wt 148.0 lb

## 2013-11-21 DIAGNOSIS — Z01419 Encounter for gynecological examination (general) (routine) without abnormal findings: Secondary | ICD-10-CM | POA: Insufficient documentation

## 2013-11-21 DIAGNOSIS — Z0000 Encounter for general adult medical examination without abnormal findings: Secondary | ICD-10-CM

## 2013-11-21 NOTE — Patient Instructions (Signed)
Continue nuva ring Physical in 1 year

## 2013-11-21 NOTE — Progress Notes (Signed)
Patient ID: Norma Vaughn, female   DOB: [birth year], 28 y.o.   MRN: [id number] History of Present Illness: [name] is a 28 year old white female, sp recent miscarriage, in for pap and physical.   Current Medications, Allergies, Past Medical History, Past Surgical History, Family History and Social History were reviewed in [facility] electronic medical record.     Review of Systems: Patient denies any headaches, blurred vision, shortness of breath, chest pain, abdominal pain, problems with bowel movements, urination, or intercourse. No joint pain or mood swings, is using nuva ring and last period was a little heavy.    Physical Exam:BP 118/76  Pulse 80  Ht 5' 2.5" (1.588 m)  Wt 148 lb (67.132 kg)  BMI 26.62 kg/m2  LMP 11/11/2013  Breastfeeding? No General:  Well developed, well nourished, no acute distress Skin:  Warm and dry Neck:  Midline trachea, normal thyroid Lungs; Clear to auscultation bilaterally Breast:  No dominant palpable mass, retraction, or nipple discharge Cardiovascular: Regular rate and rhythm Abdomen:  Soft, non tender, no hepatosplenomegaly Pelvic:  External genitalia is normal in appearance.  The vagina is normal in appearance. The cervix is bulbous. Pap performed.Nuva ring in place. Uterus is felt to be normal size, shape, and contour.  No adnexal masses or tenderness noted. Extremities:  No swelling or varicosities noted Psych:  No mood changes, alert and cooperative, seems happy, has 5 children with her.   Impression: Yearly gyn exam    Plan: Continue Nuva Ring(has RX) Physical in 1 year Call prn

## 2013-12-02 ENCOUNTER — Telehealth: Payer: Self-pay | Admitting: Adult Health

## 2013-12-02 NOTE — Telephone Encounter (Signed)
Take ring out for 1 week then put new ring in use condoms

## 2013-12-02 NOTE — Telephone Encounter (Signed)
Pt is concerned about bleeding on the Nuva ring, states this is the third week on it and is still having bleeding.  Bleeding is heavy some days then spots for a day or so then gets heavy again.  I explained that bleeding can be normal when starting or changing birth control but she states she has been on the Nuva ring before and she did not have bleeding with it and with the recent miscarriage she is concerned.

## 2014-06-30 ENCOUNTER — Encounter: Payer: Self-pay | Admitting: Adult Health

## 2014-10-20 ENCOUNTER — Telehealth: Payer: Self-pay | Admitting: Adult Health

## 2014-10-20 NOTE — Telephone Encounter (Signed)
Spoke with pt. Pt is on NuvaRing. She usually wears the ring for 3 weeks and then out x 7 days. Last month she wore it for 4 weeks and left it out x 7 days. She had a negative pregnancy test last month. Pt took NuvaRing out Thursday and her periods are not like normal. It's very light and no cramping at all(she usually cramps pretty bad). Please advise. Thanks!! JSY

## 2014-10-20 NOTE — Telephone Encounter (Signed)
Spoke with pt letting her know to take another pregnancy test and if it's negative, can put a new NuvaRing in on Thursday. Advised to use backup for 2 weeks if she has sex per Selena BattenKim. Pt voiced understanding. JSY

## 2014-11-17 ENCOUNTER — Telehealth: Payer: Self-pay | Admitting: Adult Health

## 2014-11-17 ENCOUNTER — Other Ambulatory Visit: Payer: Self-pay | Admitting: *Deleted

## 2014-11-17 MED ORDER — ETONOGESTREL-ETHINYL ESTRADIOL 0.12-0.015 MG/24HR VA RING: VAGINAL_RING | VAGINAL | Status: DC

## 2014-11-17 NOTE — Telephone Encounter (Signed)
Needs refill on nuva ring til appt, will do

## 2014-11-25 ENCOUNTER — Encounter: Payer: Self-pay | Admitting: Adult Health

## 2014-11-25 ENCOUNTER — Ambulatory Visit (INDEPENDENT_AMBULATORY_CARE_PROVIDER_SITE_OTHER): Payer: Medicaid Other | Admitting: Adult Health

## 2014-11-25 VITALS — BP 104/60 | HR 76 | Ht 63.0 in | Wt 149.0 lb

## 2014-11-25 DIAGNOSIS — Z0000 Encounter for general adult medical examination without abnormal findings: Secondary | ICD-10-CM | POA: Diagnosis not present

## 2014-11-25 DIAGNOSIS — N920 Excessive and frequent menstruation with regular cycle: Secondary | ICD-10-CM

## 2014-11-25 DIAGNOSIS — N946 Dysmenorrhea, unspecified: Secondary | ICD-10-CM

## 2014-11-25 DIAGNOSIS — Z01419 Encounter for gynecological examination (general) (routine) without abnormal findings: Secondary | ICD-10-CM

## 2014-11-25 DIAGNOSIS — Z3202 Encounter for pregnancy test, result negative: Secondary | ICD-10-CM | POA: Diagnosis not present

## 2014-11-25 DIAGNOSIS — Z30013 Encounter for initial prescription of injectable contraceptive: Secondary | ICD-10-CM

## 2014-11-25 HISTORY — DX: Excessive and frequent menstruation with regular cycle: N92.0

## 2014-11-25 HISTORY — DX: Dysmenorrhea, unspecified: N94.6

## 2014-11-25 LAB — POCT URINE PREGNANCY: Preg Test, Ur: NEGATIVE

## 2014-11-25 MED ORDER — MEDROXYPROGESTERONE ACETATE 150 MG/ML IM SUSP: 150.0000 mg | INTRAMUSCULAR | Status: DC

## 2014-11-25 NOTE — Patient Instructions (Signed)

## 2014-11-25 NOTE — Progress Notes (Addendum)
Patient ID: Norma Vaughn Kastens, female   DOB: [birth year], 29 y.o.   MRN: [id number] History of Present Illness: Norma Vaughn is a 29 year old white female, married in for well woman gyn exam.She had a normal pap 11/21/13.She is using nuva ring and periods are 7-10 days and heavy and has cramps.She is wanting something more permanent, she thinks.She has 5 children.She is not sure if had miscarriage, had heavy bleeding with clots.   Current Medications, Allergies, Past Medical History, Past Surgical History, Family History and Social History were reviewed in [facility] electronic medical record.     Review of Systems: Patient denies any headaches, hearing loss, fatigue, blurred vision, shortness of breath, chest pain, abdominal pain, problems with bowel movements, urination, or intercourse. No joint pain or mood swings.See HPI for positives.    Physical Exam:BP 104/60 mmHg  Pulse 76  Ht 5\' 3"  (1.6 m)  Wt 149 lb (67.586 kg)  BMI 26.40 kg/m2  LMP 11/16/2014 UPT negative General:  Well developed, well nourished, no acute distress Skin:  Warm and dry Neck:  Midline trachea, normal thyroid, good ROM, no lymphadenopathy Lungs; Clear to auscultation bilaterally Breast:  No dominant palpable mass, retraction, or nipple discharge Cardiovascular: Regular rate and rhythm Abdomen:  Soft, non tender, no hepatosplenomegaly Pelvic:  External genitalia is normal in appearance, no lesions.  The vagina is normal in appearance. Urethra has no lesions or masses. The cervix is bulbous.  Uterus is felt to be normal size, shape, and contour.  No adnexal masses or tenderness noted.Bladder is non tender, no masses felt.Nuva ring in place. Extremities/musculoskeletal:  No swelling or varicosities noted, no clubbing or cyanosis Psych:  No mood changes, alert and cooperative,seems happy Discussed options and will try depo first to see if stops heavy periods and think about tubal.  Impression: Well woman gyn exam no  pap Contraceptive management Menorrhagia Dysmenorrhea     Plan: Continue nuva ring for now Rx Depo provera 150 mg, disp.# 1 vail for IM injection every 3 months in office with 4 refills Call with next period for first depo Review handouts on tubal and depo provera Pap and physical in 1 year

## 2015-01-27 ENCOUNTER — Telehealth: Payer: Self-pay | Admitting: *Deleted

## 2015-01-27 NOTE — Telephone Encounter (Signed)
Had brown spotting mid cycle and cramping, probably ovulation. And was stressed baby in hospital.

## 2015-03-09 ENCOUNTER — Telehealth: Payer: Self-pay | Admitting: *Deleted

## 2015-03-09 NOTE — Telephone Encounter (Signed)
Complaining cramping x 2 weeks, LMP 6/17  And it was light, lets get US, to call in am get US for Wednesday...she said he mom had ovarian cancer, and she is worried.

## 2015-03-10 ENCOUNTER — Other Ambulatory Visit: Payer: Self-pay | Admitting: Adult Health

## 2015-03-10 DIAGNOSIS — R102 Pelvic and perineal pain: Secondary | ICD-10-CM

## 2015-03-11 ENCOUNTER — Encounter: Payer: Self-pay | Admitting: Adult Health

## 2015-03-11 ENCOUNTER — Ambulatory Visit (INDEPENDENT_AMBULATORY_CARE_PROVIDER_SITE_OTHER): Payer: Medicaid Other

## 2015-03-11 ENCOUNTER — Ambulatory Visit (INDEPENDENT_AMBULATORY_CARE_PROVIDER_SITE_OTHER): Payer: Medicaid Other | Admitting: Adult Health

## 2015-03-11 VITALS — BP 100/68 | HR 64 | Ht 62.5 in | Wt 155.0 lb

## 2015-03-11 DIAGNOSIS — N739 Female pelvic inflammatory disease, unspecified: Secondary | ICD-10-CM

## 2015-03-11 DIAGNOSIS — N921 Excessive and frequent menstruation with irregular cycle: Secondary | ICD-10-CM

## 2015-03-11 DIAGNOSIS — R102 Pelvic and perineal pain: Secondary | ICD-10-CM

## 2015-03-11 DIAGNOSIS — Z3202 Encounter for pregnancy test, result negative: Secondary | ICD-10-CM

## 2015-03-11 HISTORY — DX: Female pelvic inflammatory disease, unspecified: N73.9

## 2015-03-11 HISTORY — DX: Excessive and frequent menstruation with irregular cycle: N92.1

## 2015-03-11 HISTORY — DX: Pelvic and perineal pain: R10.2

## 2015-03-11 LAB — POCT URINALYSIS DIPSTICK
GLUCOSE UA: NEGATIVE
Leukocytes, UA: NEGATIVE
Nitrite, UA: NEGATIVE
PROTEIN UA: NEGATIVE
RBC UA: NEGATIVE

## 2015-03-11 LAB — POCT URINE PREGNANCY: Preg Test, Ur: NEGATIVE

## 2015-03-11 MED ORDER — HYDROCODONE-ACETAMINOPHEN 5-325 MG PO TABS: 1.0000 | ORAL_TABLET | Freq: Four times a day (QID) | ORAL | Status: DC | PRN

## 2015-03-11 MED ORDER — DOXYCYCLINE HYCLATE 100 MG PO TABS: 100.0000 mg | ORAL_TABLET | Freq: Two times a day (BID) | ORAL | Status: DC

## 2015-03-11 MED ORDER — CEFTRIAXONE SODIUM 1 G IJ SOLR
1.0000 g | Freq: Once | INTRAMUSCULAR | Status: AC
  Administered 2015-03-11: 1 g via INTRAMUSCULAR

## 2015-03-11 NOTE — Addendum Note (Signed)
Addended by: Colen DarlingYOUNG, Devonn Giampietro S on: 03/11/2015 05:02 PM   Modules accepted: Orders

## 2015-03-11 NOTE — Progress Notes (Signed)
PELVIC US TA/TV: normal anteverted uterus,normal ov's bilat,no free fluid,EEC 7.659mm

## 2015-03-11 NOTE — Patient Instructions (Signed)
Pelvic Inflammatory Disease °Pelvic inflammatory disease (PID) refers to an infection in some or all of the female organs. The infection can be in the uterus, ovaries, fallopian tubes, or the surrounding tissues in the pelvis. PID can cause abdominal or pelvic pain that comes on suddenly (acute pelvic pain). PID is a serious infection because it can lead to lasting (chronic) pelvic pain or the inability to have children (infertile).  °CAUSES  °The infection is often caused by the normal bacteria found in the vaginal tissues. PID may also be caused by an infection that is spread during sexual contact. PID can also occur following:  °· The birth of a baby.   °· A miscarriage.   °· An abortion.   °· Major pelvic surgery.   °· The use of an intrauterine device (IUD).   °· A sexual assault.   °RISK FACTORS °Certain factors can put a person at higher risk for PID, such as: °· Being younger than 25 years. °· Being sexually active at a young age. °· Using nonbarrier contraception. °· Having multiple sexual partners. °· Having sex with someone who has symptoms of a genital infection. °· Using oral contraception. °Other times, certain behaviors can increase the possibility of getting PID, such as: °· Having sex during your period. °· Using a vaginal douche. °· Having an intrauterine device (IUD) in place. °SYMPTOMS  °· Abdominal or pelvic pain.   °· Fever.   °· Chills.   °· Abnormal vaginal discharge. °· Abnormal uterine bleeding.   °· Unusual pain shortly after finishing your period. °DIAGNOSIS  °Your caregiver will choose some of the following methods to make a diagnosis, such as:  °· Performing a physical exam and history. A pelvic exam typically reveals a very tender uterus and surrounding pelvis.   °· Ordering laboratory tests including a pregnancy test, blood tests, and urine test.  °· Ordering cultures of the vagina and cervix to check for a sexually transmitted infection (STI). °· Performing an ultrasound.    °· Performing a laparoscopic procedure to look inside the pelvis.   °TREATMENT  °· Antibiotic medicines may be prescribed and taken by mouth.   °· Sexual partners may be treated when the infection is caused by a sexually transmitted disease (STD).   °· Hospitalization may be needed to give antibiotics intravenously. °· Surgery may be needed, but this is rare. °It may take weeks until you are completely well. If you are diagnosed with PID, you should also be checked for human immunodeficiency virus (HIV).   °HOME CARE INSTRUCTIONS  °· If given, take your antibiotics as directed. Finish the medicine even if you start to feel better.   °· Only take over-the-counter or prescription medicines for pain, discomfort, or fever as directed by your caregiver.   °· Do not have sexual intercourse until treatment is completed or as directed by your caregiver. If PID is confirmed, your recent sexual partner(s) will need treatment.   °· Keep your follow-up appointments. °SEEK MEDICAL CARE IF:  °· You have increased or abnormal vaginal discharge.   °· You need prescription medicine for your pain.   °· You vomit.   °· You cannot take your medicines.   °· Your partner has an STD.   °SEEK IMMEDIATE MEDICAL CARE IF:  °· You have a fever.   °· You have increased abdominal or pelvic pain.   °· You have chills.   °· You have pain when you urinate.   °· You are not better after 72 hours following treatment.   °MAKE SURE YOU:  °· Understand these instructions. °· Will watch your condition. °· Will get help right away if you are not doing well or get worse. °  Document Released: 08/15/2005 Document Revised: 12/10/2012 Document Reviewed: 08/11/2011 [facility] Patient Information 2015 [place], [place]. This information is not intended to replace advice given to you by your health care provider. Make sure you discuss any questions you have with your health care provider. Pelvic Pain Female pelvic pain can be caused by many different things and  start from a variety of places. Pelvic pain refers to pain that is located in the lower half of the abdomen and between your hips. The pain may occur over a short period of time (acute) or may be reoccurring (chronic). The cause of pelvic pain may be related to disorders affecting the female reproductive organs (gynecologic), but it may also be related to the bladder, kidney stones, an intestinal complication, or muscle or skeletal problems. Getting help right away for pelvic pain is important, especially if there has been severe, sharp, or a sudden onset of unusual pain. It is also important to get help right away because some types of pelvic pain can be life threatening.  CAUSES  Below are only some of the causes of pelvic pain. The causes of pelvic pain can be in one of several categories.   Gynecologic.  Pelvic inflammatory disease.  Sexually transmitted infection.  Ovarian cyst or a twisted ovarian ligament (ovarian torsion).  Uterine lining that grows outside the uterus (endometriosis).  Fibroids, cysts, or tumors.  Ovulation.  Pregnancy.  Pregnancy that occurs outside the uterus (ectopic pregnancy).  Miscarriage.  Labor.  Abruption of the placenta or ruptured uterus.  Infection.  Uterine infection (endometritis).  Bladder infection.  Diverticulitis.  Miscarriage related to a uterine infection (septic abortion).  Bladder.  Inflammation of the bladder (cystitis).  Kidney stone(s).  Gastrointestinal.  Constipation.  Diverticulitis.  Neurologic.  Trauma.  Feeling pelvic pain because of mental or emotional causes (psychosomatic).  Cancers of the bowel or pelvis. EVALUATION  Your caregiver will want to take a careful history of your concerns. This includes recent changes in your health, a careful gynecologic history of your periods (menses), and a sexual history. Obtaining your family history and medical history is also important. Your caregiver may suggest  a pelvic exam. A pelvic exam will help identify the location and severity of the pain. It also helps in the evaluation of which organ system may be involved. In order to identify the cause of the pelvic pain and be properly treated, your caregiver may order tests. These tests may include:   A pregnancy test.  Pelvic ultrasonography.  An X-ray exam of the abdomen.  A urinalysis or evaluation of vaginal discharge.  Blood tests. HOME CARE INSTRUCTIONS   Only take over-the-counter or prescription medicines for pain, discomfort, or fever as directed by your caregiver.   Rest as directed by your caregiver.   Eat a balanced diet.   Drink enough fluids to make your urine clear or pale yellow, or as directed.   Avoid sexual intercourse if it causes pain.   Apply warm or cold compresses to the lower abdomen depending on which one helps the pain.   Avoid stressful situations.   Keep a journal of your pelvic pain. Write down when it started, where the pain is located, and if there are things that seem to be associated with the pain, such as food or your menstrual cycle.  Follow up with your caregiver as directed.  SEEK MEDICAL CARE IF:  Your medicine does not help your pain.  You have abnormal vaginal discharge. SEEK IMMEDIATE MEDICAL CARE IF:  You have heavy bleeding from the vagina.   Your pelvic pain increases.   You feel light-headed or faint.   You have chills.   You have pain with urination or blood in your urine.   You have uncontrolled diarrhea or vomiting.   You have a fever or persistent symptoms for more than 3 days.  You have a fever and your symptoms suddenly get worse.   You are being physically or sexually abused.  MAKE SURE YOU:  Understand these instructions.  Will watch your condition.  Will get help if you are not doing well or get worse. Document Released: 07/12/2004 Document Revised: 12/30/2013 Document Reviewed:  12/05/2011 [facility] Patient Information 2015 [place], [place]. This information is not intended to replace advice given to you by your health care provider. Make sure you discuss any questions you have with your health care provider. Take doxycycline 1 bid x 10 days Follow up in 5 days

## 2015-03-11 NOTE — Progress Notes (Signed)
Subjective:     Patient ID: Norma Vaughn, female   DOB: [birth year], 29 y.o.   MRN: [id number]  HPI [name] is a 29 year old female in complaining of pelvic pain x 2 weeks,but has been on and off since April and  has had BTB too since June.No birth control since March.She says sex has hurt too lately.No vomiting, diarrhea or fever.Did have nausea.    Review of Systems Patient denies any headaches, hearing loss, fatigue, blurred vision, shortness of breath, chest pain, problems with bowel movements, urination.  No joint pain or mood swings. See HPI for positives. Reviewed past medical,surgical, social and family history. Reviewed medications and allergies.     Objective:   Physical Exam BP 100/68 mmHg  Pulse 64  Ht 5' 2.5" (1.588 m)  Wt 155 lb (70.308 kg)  BMI 27.88 kg/m2  LMP 02/13/2015 UPT negative, urine dipstick negative, US showed:Uterus 9.2 x 6.6 x 4.6 cm, normal anteverted uterus  Endometrium 7.9 mm, symmetrical, wnl  Right ovary 3.6 x 4.3 x 1.73 cm, wnl  Left ovary 3.2 x 2.3 x 2 cm, wnl    Technician Comments:  PELVIC US TA/TV: normal anteverted uterus,normal ov's bilat,no free fluid,EEC 7.31m  Skin warm and dry.Pelvic: external genitalia is normal in appearance no lesions, vagina: white discharge without odor,urethra has no lesions or masses noted, cervix:smooth and bulbous, GC/CHL obtained, +CMT, uterus: normal size, shape and contour,+ tender, no masses felt, adnexa: no masses, has tenderness noted across with more pain to left. Bladder is non tender and no masses felt.Dr EElonda Huskyfor co exam, and he agrees with plan of Rocephin and doxycycline for possible PID.     Assessment:     Pelvic pain  BTB PID    Plan:     Rx doxycycline 100 mg 1 bid x 10 days Rocephin 1 gm given IM in office by J. Young LPN Rx norco 57-622mg #30 take 1 every 6 hours prn pain Check CBC,CMP,ESR and GC/CHL Follow up in 5  days Review handout on PID and pelvic pain

## 2015-03-12 ENCOUNTER — Telehealth: Payer: Self-pay | Admitting: Adult Health

## 2015-03-12 LAB — SEDIMENTATION RATE: Sed Rate: 2 mm/hr (ref 0–32)

## 2015-03-12 LAB — CBC
Hematocrit: 43.2 % (ref 34.0–46.6)
Hemoglobin: 14.8 g/dL (ref 11.1–15.9)
MCH: 31 pg (ref 26.6–33.0)
MCHC: 34.3 g/dL (ref 31.5–35.7)
MCV: 90 fL (ref 79–97)
PLATELETS: 168 10*3/uL (ref 150–379)
RBC: 4.78 x10E6/uL (ref 3.77–5.28)
RDW: 12.9 % (ref 12.3–15.4)
WBC: 7.5 10*3/uL (ref 3.4–10.8)

## 2015-03-12 LAB — COMPREHENSIVE METABOLIC PANEL
ALT: 11 IU/L (ref 0–32)
AST: 15 IU/L (ref 0–40)
Albumin/Globulin Ratio: 1.8 (ref 1.1–2.5)
Albumin: 4.2 g/dL (ref 3.5–5.5)
Alkaline Phosphatase: 58 IU/L (ref 39–117)
BILIRUBIN TOTAL: 0.7 mg/dL (ref 0.0–1.2)
BUN/Creatinine Ratio: 11 (ref 8–20)
BUN: 8 mg/dL (ref 6–20)
CHLORIDE: 101 mmol/L (ref 97–108)
CO2: 23 mmol/L (ref 18–29)
CREATININE: 0.74 mg/dL (ref 0.57–1.00)
Calcium: 9 mg/dL (ref 8.7–10.2)
GFR calc Af Amer: 127 mL/min/{1.73_m2} (ref >59)
GFR calc non Af Amer: 111 mL/min/{1.73_m2} (ref >59)
GLOBULIN, TOTAL: 2.4 g/dL (ref 1.5–4.5)
Glucose: 74 mg/dL (ref 65–99)
Potassium: 4.2 mmol/L (ref 3.5–5.2)
Sodium: 140 mmol/L (ref 134–144)
Total Protein: 6.6 g/dL (ref 6.0–8.5)

## 2015-03-12 NOTE — Telephone Encounter (Signed)
Pt aware labs normal ,and she feels a little better

## 2015-03-13 LAB — GC/CHLAMYDIA PROBE AMP
CHLAMYDIA, DNA PROBE: NEGATIVE
Neisseria gonorrhoeae by PCR: NEGATIVE

## 2015-03-17 ENCOUNTER — Encounter: Payer: Self-pay | Admitting: Adult Health

## 2015-03-17 ENCOUNTER — Ambulatory Visit (INDEPENDENT_AMBULATORY_CARE_PROVIDER_SITE_OTHER): Payer: Medicaid Other | Admitting: Adult Health

## 2015-03-17 VITALS — BP 104/68 | HR 76 | Ht 62.0 in | Wt 157.0 lb

## 2015-03-17 DIAGNOSIS — Z8742 Personal history of other diseases of the female genital tract: Secondary | ICD-10-CM | POA: Diagnosis not present

## 2015-03-17 NOTE — Progress Notes (Signed)
Subjective:     Patient ID: Norma Vaughn, female   DOB: 08-18-1986, 29 y.o.   MRN: [id number]  HPI Norma Vaughn is a [age] year old white female back in follow up of pelvic pain and PID and feels much better, no pain,started feeling better Sunday.  Review of Systems Patient denies any headaches, hearing loss, fatigue, blurred vision, shortness of breath, chest pain, abdominal pain, problems with bowel movements, urination, or intercourse. No joint pain or mood swings. Reviewed past medical,surgical, social and family history. Reviewed medications and allergies.     Objective:   Physical Exam BP 104/68 mmHg  Pulse 76  Ht 5\' 2"  (1.575 m)  Wt 157 lb (71.215 kg)  BMI 28.71 kg/m2  LMP 03/12/2015 Skin warm and dry.Pelvic: external genitalia is normal in appearance no lesions, vagina: scant discharge without odor,urethra has no lesions or masses noted, cervix:smooth and bulbous,No CMT uterus: normal size, shape and contour, non tender, no masses felt, adnexa: no masses or tenderness noted. Bladder is non tender and no masses felt.    Assessment:     History of PID    Plan:     Finish  doxycyline Follow up prn

## 2015-03-17 NOTE — Patient Instructions (Signed)
Finish meds Follow up prn 

## 2015-04-03 ENCOUNTER — Telehealth: Payer: Self-pay | Admitting: *Deleted

## 2015-04-03 NOTE — Telephone Encounter (Signed)
Pt requesting medication to stop vaginal bleeding. Pt informed Cyril Mourning, NP out of office until Monday. Pt verbalized understanding.

## 2015-04-06 MED ORDER — MEGESTROL ACETATE 40 MG PO TABS: 40.0000 mg | ORAL_TABLET | Freq: Every day | ORAL | Status: DC

## 2015-04-06 NOTE — Telephone Encounter (Signed)
Having BTB will try megace

## 2015-04-28 ENCOUNTER — Telehealth: Payer: Self-pay | Admitting: Adult Health

## 2015-04-28 NOTE — Telephone Encounter (Signed)
Spoke with pt. Pt has been taking Megace 1 daily. She has had no bleeding. Pt wondered if she needed to continue taking the Megace. I spoke with JAG and she advised to continue the Megace for now since it was helping with the bleeding. Pt voiced understanding. JSY

## 2015-06-05 ENCOUNTER — Telehealth: Payer: Self-pay | Admitting: Adult Health

## 2015-06-05 NOTE — Telephone Encounter (Signed)
Pt states been bleeding by 2 weeks with megace. Pt states vaginal bleeding is not as heavy and come and goes. Pt advised to continue the Megace as prescribed, Cyril Mourning, NP will be back in the office on Monday, October 10,2016, will route message to [place].

## 2015-06-08 ENCOUNTER — Other Ambulatory Visit: Payer: Self-pay | Admitting: *Deleted

## 2015-06-08 MED ORDER — MEGESTROL ACETATE 40 MG PO TABS: 40.0000 mg | ORAL_TABLET | Freq: Every day | ORAL | Status: DC

## 2015-06-08 NOTE — Telephone Encounter (Signed)
Left message to continue megace but if she wants to try without it it is OK

## 2015-06-15 ENCOUNTER — Telehealth: Payer: Self-pay | Admitting: Adult Health

## 2015-06-15 NOTE — Telephone Encounter (Signed)
Spoke with pt. She is still having bleeding despite taking Megace. Advised she would need to be seen. Call transferred to front desk. JSY

## 2015-06-18 ENCOUNTER — Encounter: Payer: Self-pay | Admitting: Adult Health

## 2015-06-18 ENCOUNTER — Ambulatory Visit (INDEPENDENT_AMBULATORY_CARE_PROVIDER_SITE_OTHER): Payer: Medicaid Other | Admitting: Adult Health

## 2015-06-18 VITALS — BP 112/84 | HR 72 | Ht 62.0 in | Wt 162.0 lb

## 2015-06-18 DIAGNOSIS — N921 Excessive and frequent menstruation with irregular cycle: Secondary | ICD-10-CM | POA: Diagnosis not present

## 2015-06-18 DIAGNOSIS — N946 Dysmenorrhea, unspecified: Secondary | ICD-10-CM

## 2015-06-18 DIAGNOSIS — N920 Excessive and frequent menstruation with regular cycle: Secondary | ICD-10-CM

## 2015-06-18 NOTE — Progress Notes (Signed)
Subjective:     Patient ID: Norma Vaughn, female   DOB: 10-10-1985, 29 y.o.   MRN: [id number]  HPI Norma Vaughn is a 29 year old white female, married in complaining een with megace, periods can be heavy and has bad cramps, feels nauseated at times. Has gained some weight while on megace too.  Review of Systems Patient denies any headaches, hearing loss, fatigue, blurred vision, shortness of breath, chest pain,  problems with bowel movements, urination, or intercourse. No joint pain or mood swings.See HPI for positives. Reviewed past medical,surgical, social and family history. Reviewed medications and allergies.     Objective:   Physical Exam BP 112/84 mmHg  Pulse 72  Ht 5\' 2"  (1.575 m)  Wt 162 lb (73.483 kg)  BMI 29.62 kg/m2 Skin warm and dry.Pelvic: external genitalia is normal in appearance no lesions, vagina: scant discharge without odor,urethra has no lesions or masses noted, cervix:smooth and bulbous, uterus: normal size, shape and contour, non tender, no masses felt, adnexa: no masses or tenderness noted. Bladder is non tender and no masses felt.    Discussed ablation and she wants to get that, she says her mom had hysterectomy due to bleeding. Tubal papers signed, if she decides she wants that too, she does not want any more children.  Assessment:     Irregular bleeding Menorrhagia Dysmenorrhea     Plan:     Return in 1 week for talk surgery with Dr Emelda FearFerguson Review handouts on tubal and abaltion If bleeding returns take 3 megace

## 2015-06-18 NOTE — Patient Instructions (Signed)
Return in 1 week to talk surgery with DR Emelda FearFerguson Review handouts on tubal and ablation

## 2015-06-25 ENCOUNTER — Encounter: Payer: Self-pay | Admitting: Obstetrics and Gynecology

## 2015-06-25 ENCOUNTER — Ambulatory Visit (INDEPENDENT_AMBULATORY_CARE_PROVIDER_SITE_OTHER): Payer: Medicaid Other | Admitting: Obstetrics and Gynecology

## 2015-06-25 VITALS — BP 120/70 | Ht 62.0 in | Wt 162.0 lb

## 2015-06-25 DIAGNOSIS — N920 Excessive and frequent menstruation with regular cycle: Secondary | ICD-10-CM | POA: Diagnosis not present

## 2015-06-25 DIAGNOSIS — Z309 Encounter for contraceptive management, unspecified: Secondary | ICD-10-CM | POA: Diagnosis not present

## 2015-06-25 DIAGNOSIS — N946 Dysmenorrhea, unspecified: Secondary | ICD-10-CM | POA: Diagnosis not present

## 2015-06-25 NOTE — Progress Notes (Signed)
Patient ID: Norma Vaughn, female   DOB: August 25, 1986, 29 y.o.   MRN: [id number]  Preoperative History and Physical  Norma BucksDanielle Kempe is a 29 y.o. [id number] here for surgical management of menorrhagia. Pt states she has always had heavy menstrual bleeding and had to be put on birth control pills at age 29 for mgmt.. She reports at times is bleeding has clots in in it. Pt reports the bleeding has let up since having her children but still bleeding anywhere from 10-14 days at a time. She denies any SUI, UI or other urinary concerns. She states she has to splint to defecate every 2 weeks and has to disimpact self 2x per year. Pt has bowel movements 2x per week and sometimes only 1x per week. Her last US was in July. Her last pap was in 2015 and it was normal. No other significant preoperative concerns.  Proposed surgery: vaginal hysterectomy with posterior repair  Past Medical History  Diagnosis Date  . No pertinent past medical history   . Contraceptive management 11/07/2013  . Migraines   . Menorrhagia with regular cycle 11/25/2014  . Dysmenorrhea 11/25/2014  . Pelvic pain in female 03/11/2015  . Irregular intermenstrual bleeding 03/11/2015  . PID (pelvic inflammatory disease) 03/11/2015   Past Surgical History  Procedure Laterality Date  . Dilation and curettage of uterus    . Cesarean section  07/31/2012    Procedure: CESAREAN SECTION;  Surgeon: Willodean Rosenthalarolyn Harraway-Smith, MD;  Location: [facility];  Service: Gynecology;  Laterality: N/A;  primary   OB History  Gravida Para Term Preterm AB SAB TAB Ectopic Multiple Living  8 5 3 2 2 2   1 5     # Outcome Date GA Lbr Len/2nd Weight Sex Delivery Anes PTL Lv  8 SAB 05/2013          7A Preterm 07/31/12 5110w0d  4 lb 8.7 oz (2.06 kg) F CS-LTranv Spinal  Y  7B Preterm 07/31/12 2910w0d   F CS-LTranv Spinal  Y  6 Term 11/17/10    [name] Vag-Spont   Y  5 SAB 02/11/10 297w0d            Comments: twins  4 Preterm 06/26/09 6765w5d   [name] Vag-Spont   FD  3 Term 11/02/07     F Vag-Spont   Y  2 Term 12/04/05    F Vag-Spont   Y  1 Gravida              Comments: System Generated. Please review and update pregnancy details.    Patient denies any other pertinent gynecologic issues.   Current Outpatient Prescriptions on File Prior to Visit  Medication Sig Dispense Refill  . acetaminophen (TYLENOL) 325 MG tablet Take 650 mg by mouth every 6 (six) hours as needed. pain    . HYDROcodone-acetaminophen (NORCO/VICODIN) 5-325 MG per tablet Take 1 tablet by mouth every 6 (six) hours as needed. 30 tablet 0  . megestrol (MEGACE) 40 MG tablet Take 1 tablet (40 mg total) by mouth daily. 30 tablet 2  . Prenatal Vit-Fe Fumarate-FA (PRENATAL MULTIVITAMIN) TABS Take 1 tablet by mouth daily.     No current facility-administered medications on file prior to visit.   Allergies  Allergen Reactions  . Bee Pollen Hives    Allergic to bee's    Social History:   reports that she quit smoking about 10 years ago. Her smoking use included Cigarettes. She has never used smokeless tobacco. She reports that she does not drink  alcohol or use illicit drugs.  Family History  Problem Relation Age of Onset  . Hypertension Mother   . Blindness Mother   . Cancer Mother     ovarian  . Diabetes Maternal Aunt   . Diabetes Maternal Uncle   . Hypertension Maternal Uncle   . Stroke Maternal Uncle   . Emphysema Maternal Uncle   . Diabetes Maternal Grandmother   . Cancer Maternal Grandmother     breast, ovarian  . Cancer Maternal Grandfather   . Diabetes Maternal Aunt   . Cancer Maternal Aunt     breast  . Hypertension Maternal Aunt   . Hypertension Maternal Uncle   . Hypertension Maternal Uncle   . Hypertension Daughter   . Other Daughter     renal blockage  . Other Son     stillborn  . Emphysema Father     Review of Systems: Noncontributory  PHYSICAL EXAM: Blood pressure 120/70, height  (1.575 m), weight 162 lb (73.483 kg), last menstrual period 05/28/2015. General  appearance - alert, well appearing, and in no distress Chest - clear to auscultation, no wheezes, rales or rhonchi, symmetric air entry Heart - normal rate and regular rhythm Abdomen - soft, nontender, nondistended, no masses or organomegaly Pelvic - normal external genitalia, multiparous introitus. Easy access to uterus. 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 Anterior uterus that appears normal size Rectal: small rectocele to greater than 90 degrees, thin perineal body. Intact anal sphincter Extremities - peripheral pulses normal, no pedal edema, no clubbing or cyanosis  Labs: No results found for this or any previous visit (from the past 336 hour(s)).  Imaging Studies: No results found. Pap smear normal in 2015, considered current. Assessment: Patient Active Problem List   Diagnosis Date Noted  . Pelvic pain in female 03/11/2015  . Irregular intermenstrual bleeding 03/11/2015  . PID (pelvic inflammatory disease) 03/11/2015  . Menorrhagia with regular cycle 11/25/2014  . Dysmenorrhea 11/25/2014  . Contraceptive management 11/07/2013    Plan: Patient will undergo surgical management with vaginal hysterectomy with posterior repair   .mec 06/25/2015 2:13 PM   By signing my name below, I, Jarvis Morgan, attest that this documentation has been prepared under the direction and in the presence of Tilda Burrow, MD. Electronically Signed: Jarvis Morgan, ED Scribe. 06/25/2015. 2:18 PM.  I personally performed the services described in this documentation, which was SCRIBED in my presence. The recorded information has been reviewed and considered accurate. It has been edited as necessary during review. Tilda Burrow, MD

## 2015-06-25 NOTE — Progress Notes (Signed)
Patient ID: Norma Vaughn, female   DOB: 11-15-1985, 29 y.o.   MRN: [id number] Pt here today for pre op visit. Pt states that she wants to discuss ablation with tubal and hysterectomy.

## 2015-07-15 NOTE — Patient Instructions (Signed)
Norma Vaughn  07/15/2015     @   Your procedure is scheduled on 07/21/2015  Report to [facility] at 6:15 A.M.  Call this number if you have problems the morning of surgery:  [phone]   Remember:  Do not eat food or drink liquids after midnight.  Take these medicines the morning of surgery with A SIP OF WATER Hydrocodone   Do not wear jewelry, make-up or nail polish.  Do not wear lotions, powders, or perfumes.  You may wear deodorant.  Do not shave 48 hours prior to surgery.  Men may shave face and neck.  Do not bring valuables to the hospital.  [facility] is not responsible for any belongings or valuables.  Contacts, dentures or bridgework may not be worn into surgery.  Leave your suitcase in the car.  After surgery it may be brought to your room.  For patients admitted to the hospital, discharge time will be determined by your treatment team.  Patients discharged the day of surgery will not be allowed to drive home.    Please read over the following fact sheets that you were given. Surgical Site Infection Prevention and Anesthesia Post-op Instructions    PATIENT INSTRUCTIONS POST-ANESTHESIA  IMMEDIATELY FOLLOWING SURGERY:  Do not drive or operate machinery for the first twenty four hours after surgery.  Do not make any important decisions for twenty four hours after surgery or while taking narcotic pain medications or sedatives.  If you develop intractable nausea and vomiting or a severe headache please notify your doctor immediately.  FOLLOW-UP:  Please make an appointment with your surgeon as instructed. You do not need to follow up with anesthesia unless specifically instructed to do so.  WOUND CARE INSTRUCTIONS (if applicable):  Keep a dry clean dressing on the anesthesia/puncture wound site if there is drainage.  Once the wound has quit draining you may leave it open to air.  Generally you should leave the bandage intact for twenty four  hours unless there is drainage.  If the epidural site drains for more than 36-48 hours please call the anesthesia department.  QUESTIONS?:  Please feel free to call your physician or the hospital operator if you have any questions, and they will be happy to assist you.      Hysterectomy Information  A hysterectomy is a surgery in which your uterus is removed. This surgery may be done to treat various medical problems. After the surgery, you will no longer have menstrual periods. The surgery will also make you unable to become pregnant (sterile). The fallopian tubes and ovaries can be removed (bilateral salpingo-oophorectomy) during this surgery as well.  REASONS FOR A HYSTERECTOMY  Persistent, abnormal bleeding.  Lasting (chronic) pelvic pain or infection.  The lining of the uterus (endometrium) starts growing outside the uterus (endometriosis).  The endometrium starts growing in the muscle of the uterus (adenomyosis).  The uterus falls down into the vagina (pelvic organ prolapse).  Noncancerous growths in the uterus (uterine fibroids) that cause symptoms.  Precancerous cells.  Cervical cancer or uterine cancer. TYPES OF HYSTERECTOMIES  Supracervical hysterectomy--In this type, the top part of the uterus is removed, but not the cervix.  Total hysterectomy--The uterus and cervix are removed.  Radical hysterectomy--The uterus, the cervix, and the fibrous tissue that holds the uterus in place in the pelvis (parametrium) are removed. WAYS A HYSTERECTOMY CAN BE PERFORMED  Abdominal hysterectomy--A large surgical cut (incision) is made in the abdomen. The uterus is removed  through this incision.  Vaginal hysterectomy--An incision is made in the vagina. The uterus is removed through this incision. There are no abdominal incisions.  Conventional laparoscopic hysterectomy--Three or four small incisions are made in the abdomen. A thin, lighted tube with a camera (laparoscope) is inserted  into one of the incisions. Other tools are put through the other incisions. The uterus is cut into small pieces. The small pieces are removed through the incisions, or they are removed through the vagina.  Laparoscopically assisted vaginal hysterectomy (LAVH)--Three or four small incisions are made in the abdomen. Part of the surgery is performed laparoscopically and part vaginally. The uterus is removed through the vagina.  Robot-assisted laparoscopic hysterectomy--A laparoscope and other tools are inserted into 3 or 4 small incisions in the abdomen. A computer-controlled device is used to give the surgeon a 3D image and to help control the surgical instruments. This allows for more precise movements of surgical instruments. The uterus is cut into small pieces and removed through the incisions or removed through the vagina. RISKS AND COMPLICATIONS  Possible complications associated with this procedure include:  Bleeding and risk of blood transfusion. Tell your health care provider if you do not want to receive any blood products.  Blood clots in the legs or lung.  Infection.  Injury to surrounding organs.  Problems or side effects related to anesthesia.  Conversion to an abdominal hysterectomy from one of the other techniques. WHAT TO EXPECT AFTER A HYSTERECTOMY  You will be given pain medicine.  You will need to have someone with you for the first 3-5 days after you go home.  You will need to follow up with your surgeon in 2-4 weeks after surgery to evaluate your progress.  You may have early menopause symptoms such as hot flashes, night sweats, and insomnia.  If you had a hysterectomy for a problem that was not cancer or not a condition that could lead to cancer, then you no longer need Pap tests. However, even if you no longer need a Pap test, a regular exam is a good idea to make sure no other problems are starting.   This information is not intended to replace advice given to you  by your health care provider. Make sure you discuss any questions you have with your health care provider.   Document Released: 02/08/2001 Document Revised: 06/05/2013 Document Reviewed: 04/22/2013 Elsevier Interactive Patient Education [employer]2016 Elsevier Inc.

## 2015-07-16 ENCOUNTER — Other Ambulatory Visit: Payer: Self-pay | Admitting: Obstetrics and Gynecology

## 2015-07-16 ENCOUNTER — Encounter (HOSPITAL_COMMUNITY)
Admission: RE | Admit: 2015-07-16 | Discharge: 2015-07-16 | Disposition: A | Discharge status: Home / Self Care | Payer: Medicaid Other | Source: Ambulatory Visit | Attending: Obstetrics and Gynecology | Admitting: Obstetrics and Gynecology

## 2015-07-16 ENCOUNTER — Inpatient Hospital Stay (HOSPITAL_COMMUNITY): Admission: RE | Admit: 2015-07-16 | Payer: Medicaid Other | Source: Ambulatory Visit

## 2015-07-16 ENCOUNTER — Encounter (HOSPITAL_COMMUNITY): Payer: Self-pay

## 2015-07-16 DIAGNOSIS — Z01818 Encounter for other preprocedural examination: Secondary | ICD-10-CM | POA: Insufficient documentation

## 2015-07-16 DIAGNOSIS — N920 Excessive and frequent menstruation with regular cycle: Secondary | ICD-10-CM | POA: Diagnosis not present

## 2015-07-16 HISTORY — DX: Unspecified glaucoma: H40.9

## 2015-07-16 HISTORY — DX: Anemia, unspecified: D64.9

## 2015-07-16 LAB — HCG, SERUM, QUALITATIVE: PREG SERUM: NEGATIVE

## 2015-07-16 LAB — COMPREHENSIVE METABOLIC PANEL
ALT: 17 U/L (ref 14–54)
AST: 19 U/L (ref 15–41)
Albumin: 4.2 g/dL (ref 3.5–5.0)
Alkaline Phosphatase: 56 U/L (ref 38–126)
Anion gap: 7 (ref 5–15)
BUN: 11 mg/dL (ref 6–20)
CHLORIDE: 105 mmol/L (ref 101–111)
CO2: 28 mmol/L (ref 22–32)
Calcium: 9.7 mg/dL (ref 8.9–10.3)
Creatinine, Ser: 0.71 mg/dL (ref 0.44–1.00)
Glucose, Bld: 87 mg/dL (ref 65–99)
POTASSIUM: 4.4 mmol/L (ref 3.5–5.1)
SODIUM: 140 mmol/L (ref 135–145)
Total Bilirubin: 0.8 mg/dL (ref 0.3–1.2)
Total Protein: 7 g/dL (ref 6.5–8.1)

## 2015-07-16 LAB — URINALYSIS, ROUTINE W REFLEX MICROSCOPIC
Bilirubin Urine: NEGATIVE
Glucose, UA: NEGATIVE mg/dL
Hgb urine dipstick: NEGATIVE
Ketones, ur: NEGATIVE mg/dL
LEUKOCYTES UA: NEGATIVE
Nitrite: NEGATIVE
PROTEIN: NEGATIVE mg/dL
Specific Gravity, Urine: 1.025 (ref 1.005–1.030)
pH: 6 (ref 5.0–8.0)

## 2015-07-16 LAB — CBC
HCT: 43.2 % (ref 36.0–46.0)
Hemoglobin: 14.6 g/dL (ref 12.0–15.0)
MCH: 31.5 pg (ref 26.0–34.0)
MCHC: 33.8 g/dL (ref 30.0–36.0)
MCV: 93.1 fL (ref 78.0–100.0)
PLATELETS: 175 10*3/uL (ref 150–400)
RBC: 4.64 MIL/uL (ref 3.87–5.11)
RDW: 12.4 % (ref 11.5–15.5)
WBC: 9 10*3/uL (ref 4.0–10.5)

## 2015-07-16 NOTE — Pre-Procedure Instructions (Signed)
Patient given information to sign up for my chart at home. 

## 2015-07-17 LAB — TYPE AND SCREEN
ABO/RH(D): O POS
ANTIBODY SCREEN: NEGATIVE

## 2015-07-21 ENCOUNTER — Encounter (HOSPITAL_COMMUNITY): Payer: Self-pay | Admitting: *Deleted

## 2015-07-21 ENCOUNTER — Encounter (HOSPITAL_COMMUNITY)
Admission: RE | Disposition: A | Discharge status: Home / Self Care | Payer: Self-pay | Source: Ambulatory Visit | Attending: Obstetrics and Gynecology

## 2015-07-21 ENCOUNTER — Inpatient Hospital Stay (HOSPITAL_COMMUNITY): Payer: Medicaid Other | Admitting: Anesthesiology

## 2015-07-21 ENCOUNTER — Observation Stay (HOSPITAL_COMMUNITY)
Admission: RE | Admit: 2015-07-21 | Discharge: 2015-07-22 | Disposition: A | Discharge status: Home / Self Care | Payer: Medicaid Other | Source: Ambulatory Visit | Attending: Obstetrics and Gynecology | Admitting: Obstetrics and Gynecology

## 2015-07-21 DIAGNOSIS — Z87891 Personal history of nicotine dependence: Secondary | ICD-10-CM | POA: Diagnosis not present

## 2015-07-21 DIAGNOSIS — Z90710 Acquired absence of both cervix and uterus: Secondary | ICD-10-CM | POA: Diagnosis present

## 2015-07-21 DIAGNOSIS — N920 Excessive and frequent menstruation with regular cycle: Principal | ICD-10-CM | POA: Insufficient documentation

## 2015-07-21 DIAGNOSIS — N736 Female pelvic peritoneal adhesions (postinfective): Secondary | ICD-10-CM | POA: Diagnosis not present

## 2015-07-21 DIAGNOSIS — N816 Rectocele: Secondary | ICD-10-CM | POA: Diagnosis not present

## 2015-07-21 DIAGNOSIS — N879 Dysplasia of cervix uteri, unspecified: Secondary | ICD-10-CM | POA: Diagnosis not present

## 2015-07-21 HISTORY — PX: RECTOCELE REPAIR: SHX761

## 2015-07-21 HISTORY — PX: VAGINAL HYSTERECTOMY: SHX2639

## 2015-07-21 SURGERY — HYSTERECTOMY, VAGINAL
Anesthesia: General

## 2015-07-21 MED ORDER — STERILE WATER FOR IRRIGATION IR SOLN
Status: DC | PRN
  Administered 2015-07-21: 1000 mL

## 2015-07-21 MED ORDER — ONDANSETRON HCL 4 MG/2ML IJ SOLN
4.0000 mg | Freq: Once | INTRAMUSCULAR | Status: AC
  Administered 2015-07-21: 4 mg via INTRAVENOUS

## 2015-07-21 MED ORDER — IBUPROFEN 600 MG PO TABS: 600.0000 mg | ORAL_TABLET | Freq: Four times a day (QID) | ORAL | Status: DC | PRN

## 2015-07-21 MED ORDER — ONDANSETRON HCL 4 MG/2ML IJ SOLN
INTRAMUSCULAR | Status: AC
  Filled 2015-07-21: qty 2

## 2015-07-21 MED ORDER — SODIUM CHLORIDE 0.9 % IJ SOLN
INTRAMUSCULAR | Status: AC
  Filled 2015-07-21: qty 20

## 2015-07-21 MED ORDER — ONDANSETRON HCL 4 MG/2ML IJ SOLN: 4.0000 mg | Freq: Four times a day (QID) | INTRAMUSCULAR | Status: DC | PRN

## 2015-07-21 MED ORDER — SUFENTANIL CITRATE 50 MCG/ML IV SOLN
INTRAVENOUS | Status: DC | PRN
  Administered 2015-07-21 (×5): 10 ug via INTRAVENOUS

## 2015-07-21 MED ORDER — GLYCOPYRROLATE 0.2 MG/ML IJ SOLN
INTRAMUSCULAR | Status: AC
  Filled 2015-07-21: qty 3

## 2015-07-21 MED ORDER — SODIUM CHLORIDE 0.9 % IV SOLN
INTRAVENOUS | Status: DC
  Administered 2015-07-21: 12:00:00 via INTRAVENOUS

## 2015-07-21 MED ORDER — DEXAMETHASONE SODIUM PHOSPHATE 4 MG/ML IJ SOLN
INTRAMUSCULAR | Status: AC
  Filled 2015-07-21: qty 1

## 2015-07-21 MED ORDER — EPHEDRINE SULFATE 50 MG/ML IJ SOLN
INTRAMUSCULAR | Status: AC
  Filled 2015-07-21: qty 1

## 2015-07-21 MED ORDER — ARTIFICIAL TEARS OP OINT
TOPICAL_OINTMENT | OPHTHALMIC | Status: AC
  Filled 2015-07-21: qty 7

## 2015-07-21 MED ORDER — SUCCINYLCHOLINE CHLORIDE 20 MG/ML IJ SOLN
INTRAMUSCULAR | Status: AC
  Filled 2015-07-21: qty 1

## 2015-07-21 MED ORDER — LIDOCAINE HCL (PF) 1 % IJ SOLN
INTRAMUSCULAR | Status: AC
  Filled 2015-07-21: qty 5

## 2015-07-21 MED ORDER — OXYCODONE-ACETAMINOPHEN 5-325 MG PO TABS
1.0000 | ORAL_TABLET | ORAL | Status: DC | PRN
  Administered 2015-07-21 – 2015-07-22 (×3): 1 via ORAL
  Filled 2015-07-21 (×3): qty 1

## 2015-07-21 MED ORDER — KETOROLAC TROMETHAMINE 30 MG/ML IJ SOLN: 30.0000 mg | Freq: Four times a day (QID) | INTRAMUSCULAR | Status: DC

## 2015-07-21 MED ORDER — NEOSTIGMINE METHYLSULFATE 10 MG/10ML IV SOLN
INTRAVENOUS | Status: DC | PRN
  Administered 2015-07-21: 4 mg via INTRAVENOUS

## 2015-07-21 MED ORDER — ROCURONIUM BROMIDE 100 MG/10ML IV SOLN
INTRAVENOUS | Status: DC | PRN
  Administered 2015-07-21: 35 mg via INTRAVENOUS

## 2015-07-21 MED ORDER — CEFAZOLIN SODIUM-DEXTROSE 2-3 GM-% IV SOLR
2.0000 g | INTRAVENOUS | Status: AC
  Administered 2015-07-21: 2 g via INTRAVENOUS
  Filled 2015-07-21: qty 50

## 2015-07-21 MED ORDER — SUFENTANIL CITRATE 50 MCG/ML IV SOLN
INTRAVENOUS | Status: AC
  Filled 2015-07-21: qty 1

## 2015-07-21 MED ORDER — DEXAMETHASONE SODIUM PHOSPHATE 4 MG/ML IJ SOLN
4.0000 mg | Freq: Once | INTRAMUSCULAR | Status: AC
  Administered 2015-07-21: 4 mg via INTRAVENOUS

## 2015-07-21 MED ORDER — PANTOPRAZOLE SODIUM 40 MG PO TBEC: 40.0000 mg | DELAYED_RELEASE_TABLET | Freq: Every day | ORAL | Status: DC

## 2015-07-21 MED ORDER — LACTATED RINGERS IV SOLN
INTRAVENOUS | Status: DC
  Administered 2015-07-21: 08:00:00 via INTRAVENOUS
  Administered 2015-07-21: 1000 mL via INTRAVENOUS
  Administered 2015-07-21: 09:00:00 via INTRAVENOUS

## 2015-07-21 MED ORDER — MIDAZOLAM HCL 2 MG/2ML IJ SOLN
INTRAMUSCULAR | Status: AC
  Filled 2015-07-21: qty 2

## 2015-07-21 MED ORDER — BUPIVACAINE-EPINEPHRINE (PF) 0.5% -1:200000 IJ SOLN
INTRAMUSCULAR | Status: AC
  Filled 2015-07-21: qty 30

## 2015-07-21 MED ORDER — NALOXONE HCL 0.4 MG/ML IJ SOLN
0.4000 mg | INTRAMUSCULAR | Status: DC | PRN
Start: 2015-07-21 — End: 2015-07-22

## 2015-07-21 MED ORDER — NEOSTIGMINE METHYLSULFATE 10 MG/10ML IV SOLN
INTRAVENOUS | Status: AC
  Filled 2015-07-21: qty 1

## 2015-07-21 MED ORDER — DIPHENHYDRAMINE HCL 50 MG/ML IJ SOLN: 12.5000 mg | Freq: Four times a day (QID) | INTRAMUSCULAR | Status: DC | PRN

## 2015-07-21 MED ORDER — FENTANYL CITRATE (PF) 100 MCG/2ML IJ SOLN
INTRAMUSCULAR | Status: AC
  Filled 2015-07-21: qty 2

## 2015-07-21 MED ORDER — PROPOFOL 10 MG/ML IV BOLUS
INTRAVENOUS | Status: DC | PRN
  Administered 2015-07-21: 150 mg via INTRAVENOUS

## 2015-07-21 MED ORDER — SODIUM CHLORIDE 0.9 % IJ SOLN: 9.0000 mL | INTRAMUSCULAR | Status: DC | PRN

## 2015-07-21 MED ORDER — FENTANYL CITRATE (PF) 100 MCG/2ML IJ SOLN
25.0000 ug | INTRAMUSCULAR | Status: DC | PRN
  Administered 2015-07-21 (×4): 50 ug via INTRAVENOUS

## 2015-07-21 MED ORDER — ONDANSETRON HCL 4 MG PO TABS: 4.0000 mg | ORAL_TABLET | Freq: Four times a day (QID) | ORAL | Status: DC | PRN

## 2015-07-21 MED ORDER — KETOROLAC TROMETHAMINE 30 MG/ML IJ SOLN
INTRAMUSCULAR | Status: AC
  Filled 2015-07-21: qty 1

## 2015-07-21 MED ORDER — SODIUM CHLORIDE 0.9 % IR SOLN
Status: DC | PRN
  Administered 2015-07-21: 3000 mL

## 2015-07-21 MED ORDER — DIPHENHYDRAMINE HCL 12.5 MG/5ML PO ELIX: 12.5000 mg | ORAL_SOLUTION | Freq: Four times a day (QID) | ORAL | Status: DC | PRN

## 2015-07-21 MED ORDER — PROMETHAZINE HCL 25 MG/ML IJ SOLN
12.5000 mg | INTRAMUSCULAR | Status: DC | PRN
Start: 2015-07-21 — End: 2015-07-22
  Administered 2015-07-21: 12.5 mg via INTRAVENOUS
  Filled 2015-07-21: qty 1

## 2015-07-21 MED ORDER — ONDANSETRON HCL 4 MG/2ML IJ SOLN
4.0000 mg | Freq: Once | INTRAMUSCULAR | Status: AC | PRN
  Administered 2015-07-21: 4 mg via INTRAVENOUS
  Filled 2015-07-21: qty 2

## 2015-07-21 MED ORDER — ROCURONIUM BROMIDE 50 MG/5ML IV SOLN
INTRAVENOUS | Status: AC
  Filled 2015-07-21: qty 1

## 2015-07-21 MED ORDER — LIDOCAINE HCL (CARDIAC) 20 MG/ML IV SOLN
INTRAVENOUS | Status: DC | PRN
  Administered 2015-07-21: 50 mg via INTRAVENOUS

## 2015-07-21 MED ORDER — HYDROMORPHONE 1 MG/ML IV SOLN
INTRAVENOUS | Status: AC
Start: 2015-07-21 — End: 2015-07-21
  Filled 2015-07-21: qty 25

## 2015-07-21 MED ORDER — 0.9 % SODIUM CHLORIDE (POUR BTL) OPTIME
TOPICAL | Status: DC | PRN
  Administered 2015-07-21: 1000 mL

## 2015-07-21 MED ORDER — PROPOFOL 10 MG/ML IV BOLUS
INTRAVENOUS | Status: AC
  Filled 2015-07-21: qty 20

## 2015-07-21 MED ORDER — MIDAZOLAM HCL 2 MG/2ML IJ SOLN
1.0000 mg | INTRAMUSCULAR | Status: DC | PRN
  Administered 2015-07-21: 2 mg via INTRAVENOUS

## 2015-07-21 MED ORDER — KETOROLAC TROMETHAMINE 30 MG/ML IJ SOLN
30.0000 mg | Freq: Four times a day (QID) | INTRAMUSCULAR | Status: DC
  Administered 2015-07-21 – 2015-07-22 (×3): 30 mg via INTRAVENOUS
  Filled 2015-07-21 (×3): qty 1

## 2015-07-21 MED ORDER — HYDROMORPHONE 1 MG/ML IV SOLN
INTRAVENOUS | Status: DC
  Administered 2015-07-21: 12:00:00 via INTRAVENOUS

## 2015-07-21 MED ORDER — KETOROLAC TROMETHAMINE 30 MG/ML IJ SOLN
30.0000 mg | Freq: Once | INTRAMUSCULAR | Status: AC
  Administered 2015-07-21: 30 mg via INTRAVENOUS

## 2015-07-21 MED ORDER — GLYCOPYRROLATE 0.2 MG/ML IJ SOLN
INTRAMUSCULAR | Status: DC | PRN
  Administered 2015-07-21: 0.6 mg via INTRAVENOUS

## 2015-07-21 MED ORDER — BUPIVACAINE-EPINEPHRINE 0.5% -1:200000 IJ SOLN
INTRAMUSCULAR | Status: DC | PRN
  Administered 2015-07-21: 30 mL

## 2015-07-21 SURGICAL SUPPLY — 42 items
BAG HAMPER (MISCELLANEOUS) ×3 IMPLANT
CLOTH BEACON ORANGE TIMEOUT ST (SAFETY) ×3 IMPLANT
COVER LIGHT HANDLE STERIS (MISCELLANEOUS) ×6 IMPLANT
COVER MAYO STAND XLG (DRAPE) ×3 IMPLANT
DECANTER SPIKE VIAL GLASS SM (MISCELLANEOUS) ×3 IMPLANT
DRAPE PROXIMA HALF (DRAPES) ×3 IMPLANT
DRAPE STERI URO 9X17 APER PCH (DRAPES) ×3 IMPLANT
ELECT REM PT RETURN 9FT ADLT (ELECTROSURGICAL) ×3
ELECTRODE REM PT RTRN 9FT ADLT (ELECTROSURGICAL) ×1 IMPLANT
FORMALIN 10 PREFIL 480ML (MISCELLANEOUS) ×3 IMPLANT
GAUZE PACKING 2X5 YD STRL (GAUZE/BANDAGES/DRESSINGS) ×3 IMPLANT
GAUZE SPONGE 4X4 16PLY XRAY LF (GAUZE/BANDAGES/DRESSINGS) ×3 IMPLANT
GLOVE BIO SURGEON STRL SZ 6.5 (GLOVE) ×2 IMPLANT
GLOVE BIO SURGEONS STRL SZ 6.5 (GLOVE) ×1
GLOVE BIOGEL PI IND STRL 7.0 (GLOVE) ×3 IMPLANT
GLOVE BIOGEL PI IND STRL 7.5 (GLOVE) ×1 IMPLANT
GLOVE BIOGEL PI IND STRL 9 (GLOVE) ×1 IMPLANT
GLOVE BIOGEL PI INDICATOR 7.0 (GLOVE) ×6
GLOVE BIOGEL PI INDICATOR 7.5 (GLOVE) ×2
GLOVE BIOGEL PI INDICATOR 9 (GLOVE) ×2
GLOVE ECLIPSE 6.5 STRL STRAW (GLOVE) ×3 IMPLANT
GLOVE ECLIPSE 9.0 STRL (GLOVE) ×3 IMPLANT
GOWN SPEC L3 XXLG W/TWL (GOWN DISPOSABLE) ×6 IMPLANT
GOWN STRL REUS W/TWL LRG LVL3 (GOWN DISPOSABLE) ×6 IMPLANT
IV NS IRRIG 3000ML ARTHROMATIC (IV SOLUTION) ×3 IMPLANT
KIT ROOM TURNOVER AP CYSTO (KITS) ×3 IMPLANT
MANIFOLD NEPTUNE II (INSTRUMENTS) ×3 IMPLANT
NEEDLE HYPO 25X1 1.5 SAFETY (NEEDLE) ×3 IMPLANT
NS IRRIG 1000ML POUR BTL (IV SOLUTION) ×3 IMPLANT
PACK PERI GYN (CUSTOM PROCEDURE TRAY) ×3 IMPLANT
PAD ARMBOARD 7.5X6 YLW CONV (MISCELLANEOUS) ×3 IMPLANT
SET BASIN LINEN APH (SET/KITS/TRAYS/PACK) ×3 IMPLANT
SET IRRIG Y TYPE TUR BLADDER L (SET/KITS/TRAYS/PACK) ×3 IMPLANT
SUT CHROMIC 0 CT 1 (SUTURE) ×39 IMPLANT
SUT CHROMIC 2 0 CT 1 (SUTURE) ×3 IMPLANT
SUT CHROMIC GUT BROWN 0 54 (SUTURE) IMPLANT
SUT CHROMIC GUT BROWN 0 54IN (SUTURE)
SUT PROLENE 2 0 SH 30 (SUTURE) ×6 IMPLANT
SUT VIC AB 0 CT2 8-18 (SUTURE) ×3 IMPLANT
TRAY FOLEY CATH SILVER 16FR (SET/KITS/TRAYS/PACK) ×3 IMPLANT
VERSALIGHT (MISCELLANEOUS) ×3 IMPLANT
WATER STERILE IRR 1000ML POUR (IV SOLUTION) ×3 IMPLANT

## 2015-07-21 NOTE — Anesthesia Procedure Notes (Signed)
Procedure Name: Intubation Date/Time: 07/21/2015 7:42 AM Performed by: Pernell DupreADAMS, Trany Chernick A Pre-anesthesia Checklist: Patient identified, Patient being monitored, Timeout performed, Emergency Drugs available and Suction available Patient Re-evaluated:Patient Re-evaluated prior to inductionOxygen Delivery Method: Circle System Utilized Preoxygenation: Pre-oxygenation with 100% oxygen Intubation Type: IV induction Ventilation: Mask ventilation without difficulty Laryngoscope Size: 3 and Miller Grade View: Grade I Tube type: Oral Tube size: 7.0 mm Number of attempts: 1 Airway Equipment and Method: Stylet Placement Confirmation: ETT inserted through vocal cords under direct vision,  positive ETCO2 and breath sounds checked- equal and bilateral Secured at: 21 cm Tube secured with: Tape Dental Injury: Teeth and Oropharynx as per pre-operative assessment

## 2015-07-21 NOTE — Anesthesia Postprocedure Evaluation (Signed)
Anesthesia Post Note  Patient: Norma Vaughn  Procedure(s) Performed: Procedure(s) (LRB): HYSTERECTOMY VAGINAL (N/A) POSTERIOR REPAIR (RECTOCELE) (N/A)  Patient location during evaluation: PACU Anesthesia Type: General Level of consciousness: awake and awake and alert Pain management: satisfactory to patient Vital Signs Assessment: post-procedure vital signs reviewed and stable Respiratory status: spontaneous breathing Cardiovascular status: stable Anesthetic complications: no    Last Vitals:  Filed Vitals:   07/21/15 1100 07/21/15 1115  BP: 114/70 97/58  Pulse: 71 58  Temp:    Resp: 14 10    Last Pain:  Filed Vitals:   07/21/15 1117  PainSc: 5                  Vincenzo Stave

## 2015-07-21 NOTE — Progress Notes (Signed)
Removed vaginal packing per Dr Emelda FearFerguson, pt tolerated well.  Will monitor pt for 2 hrs post packing removal and remove catheter per Dr Emelda FearFerguson.   Will continue to monitor pt.

## 2015-07-21 NOTE — Op Note (Signed)
07/21/2015  10:12 AM  PATIENT:  Norma Vaughn  29 y.o. female  PRE-OPERATIVE DIAGNOSIS:  MENORRHAGIA rectocele  POST-OPERATIVE DIAGNOSIS:  MENORRHAGIA rectocele  PROCEDURE:  Procedure(s): HYSTERECTOMY VAGINAL (N/A) POSTERIOR REPAIR (RECTOCELE) (N/A)  SURGEON:  Surgeon(s) and Role:    * Tilda BurrowJohn Nesta Kimple V, MD - Primary  PHYSICIAN ASSISTANT:   ASSISTANTS: [place] RN   ANESTHESIA:   local and general  EBL:  Total I/O In: 2300 [I.V.:2300] Out: 125 [Blood:125]  BLOOD ADMINISTERED:none  DRAINS: Urinary Catheter (Foley) and Vaginal pack   LOCAL MEDICATIONS USED:  MARCAINE   30 cc  SPECIMEN:  Source of Specimen:  Uterus and cervix  DISPOSITION OF SPECIMEN:  PATHOLOGY  COUNTS:  YES  TOURNIQUET:  * No tourniquets in log *  DICTATION: .Dragon Dictation  PLAN OF CARE: Admit for overnight observation  PATIENT DISPOSITION:  PACU - hemodynamically stable.   Delay start of Pharmacological VTE agent (>24hrs) due to surgical blood loss or risk of bleeding: not applicable  Details of procedure. Patient was taken operating room prepped and draped for vaginal procedure, with legs in standard candycane support, timeout conducted and procedure confirmed by surgical team, Ancef administered weighted speculum was inserted in the vagina, cervix grasped with Lahey thyroid tenaculum, with circumscribed with Marcaine with epinephrine 10 cc, and the cautery used to circumscribe the cervix at the cervicovaginal junction from log to 4:00, with EDC sharp dissection in the somewhat avascular vesicouterine space elevating the bladder out of the way with a retractor posterior colpotomy incision was made easily identifying the peritoneum and the cul-de-sac. A ligaments were clamped cut and suture ligated on each side in Zeppelin clamp Mayo scissors and 0 chromic suture ligature with tagging of the suture pedicle. Upper cardinal ligament was then clamped cut and suture ligated on each side and then the  upper cardinal ligament treated similarly, with 0 chromic Zeppelin clamps. Both sides of the broad ligament could be easily identified at this point and we marched up the broad ligament taking small bites with Zeppelin clamps Mayo scissors and suturing upon reaching the level of the upper aspects of the lower uterine segment the cervix and lower uterine segment were amputated off for improved visibility and access to the upper pedicles. The left side was taken down by grasping the utero-ovarian ligament in one pedicle and then taking down the tube and round ligament in a second pedicle which was then ligated and confirmed as hemostatic. On the left side and required a right angle clamp to be placed on a spot of oozing in the broad ligament which was then tied down easily and hemostasis confirmed. On the patient's right side the upper pedicle was a single pedicle of the utero-ovarian ligament round ligament and fallopian tube hemostasis was excellent on that side. Pelvis was inspected. There was some small oozing in the lower cardinal ligament which required a single sure for hemostasis. The patient had a cul-de-sac is reduced in diameter but taking 2-0 Prolene suture and then suturing the medial aspect of the uterosacral ligament to the tissue of the cul-de-sac and such way to reduce the transverse diameter of the cul-de-sac. This was completely intraperitoneal and sewed laterally to medial to avoid risk to retroperitoneal structures. Foley catheter was inserted revealing 250 cc of clear urine him and then the anterior bladder flap peritoneum was loosely tagged to the cul-de-sac vaginal cuff closed with a series of interrupted 0 chromic sutures.   Posterior repair: Allis clamps were placed at 5 and  7:00 on the posterior perineal body at the level of the hymen remnants, and then Marcaine was injected into the vaginal epithelium raising the epithelium such that sharp dissection could be performed in the midline  for a distance approximate 4 cm of the vagina and then the vaginal epithelium sharply dissected off the underlying connective tissue. The dissection was extended halfway up the vagina, then a series of horizontal mattress sutures of 0 Vicryl were used to pull the lateral supportive tissues into the perineal body reinforcing the thickness and support her body for the lower half of the vagina 2 layer closure was performed in the introitus. The diameter of the vagina was carefully monitored to ensure that adequate diameter was maintained. She'll epithelium was trimmed slightly and then reapproximated with a running 2-0 chromic suture sponge and needle counts were correct throughout. Vaginal packing with Betadine soaked 2 inch gauze 5 yards was then performed with patient going to recovery room in stable condition

## 2015-07-21 NOTE — Anesthesia Preprocedure Evaluation (Signed)
Anesthesia Evaluation  Patient identified by MRN, date of birth, ID band Patient awake    Reviewed: Allergy & Precautions, H&P , NPO status , Patient's Chart, lab work & pertinent test results, reviewed documented beta blocker date and time   History of Anesthesia Complications Negative for: history of anesthetic complications  Airway Mallampati: I  TM Distance: >3 FB Neck ROM: full    Dental  (+) Teeth Intact   Pulmonary former smoker,    breath sounds clear to auscultation       Cardiovascular negative cardio ROS   Rhythm:regular Rate:Normal     Neuro/Psych  Headaches, negative neurological ROS  negative psych ROS   GI/Hepatic negative GI ROS, Neg liver ROS,   Endo/Other  negative endocrine ROS  Renal/GU negative Renal ROS  negative genitourinary   Musculoskeletal   Abdominal   Peds  Hematology negative hematology ROS (+)   Anesthesia Other Findings )  Reproductive/Obstetrics                             Anesthesia Physical Anesthesia Plan  ASA: II  Anesthesia Plan: General   Post-op Pain Management:    Induction: Intravenous  Airway Management Planned: Oral ETT  Additional Equipment:   Intra-op Plan:   Post-operative Plan: Extubation in OR  Informed Consent: I have reviewed the patients History and Physical, chart, labs and discussed the procedure including the risks, benefits and alternatives for the proposed anesthesia with the patient or authorized representative who has indicated his/her understanding and acceptance.     Plan Discussed with:   Anesthesia Plan Comments:         Anesthesia Quick Evaluation

## 2015-07-21 NOTE — Progress Notes (Signed)
Per pt Zofran not effective on nausea.  Called Dr Emelda FearFerguson and received telephone order for Phenergan 12.5 mg IV every 3 hrs PRN.  Will continue to monitor pt.

## 2015-07-21 NOTE — H&P (Signed)
[name], MD at 06/25/2015 2:13 PM     Status: Signed       Expand All Collapse All   Patient ID: Norma Bucksanielle Amory, female DOB: [birth year], 29 y.o. MRN: [id number]  Preoperative History and Physical  Norma BucksDanielle Vaughn is a 29 y.o. [id number] here for surgical management of menorrhagia. Pt states she has always had heavy menstrual bleeding and had to be put on birth control pills at age 29 for mgmt.. She reports at times is bleeding has clots in in it. Pt reports the bleeding has let up since having her children but still bleeding anywhere from 10-14 days at a time. She denies any SUI, UI or other urinary concerns. She states she has to splint to defecate every 2 weeks and has to disimpact self 2x per year. Pt has bowel movements 2x per week and sometimes only 1x per week. Her last US was in July. Her last pap was in 2015 and it was normal. No other significant preoperative concerns.  Proposed surgery: vaginal hysterectomy with posterior repair  Past Medical History  Diagnosis Date  . No pertinent past medical history   . Contraceptive management 11/07/2013  . Migraines   . Menorrhagia with regular cycle 11/25/2014  . Dysmenorrhea 11/25/2014  . Pelvic pain in female 03/11/2015  . Irregular intermenstrual bleeding 03/11/2015  . PID (pelvic inflammatory disease) 03/11/2015   Past Surgical History  Procedure Laterality Date  . Dilation and curettage of uterus    . Cesarean section  07/31/2012    Procedure: CESAREAN SECTION; Surgeon: Willodean Rosenthalarolyn Harraway-Smith, MD; Location: [facility]; Service: Gynecology; Laterality: N/A; primary   OB History  Gravida Para Term Preterm AB SAB TAB Ectopic Multiple Living  8 5 3 2 2 2   1 5     # Outcome Date GA Lbr Len/2nd Weight Sex Delivery Anes PTL Lv  8 SAB 05/2013          7A Preterm 07/31/12 970w0d  4 lb 8.7 oz (2.06 kg) F CS-LTranv Spinal  Y   7B Preterm 07/31/12 8170w0d   F CS-LTranv Spinal  Y  6 Term 11/17/10    [name] Vag-Spont   Y  5 SAB 02/11/10 2070w0d          Comments: twins  4 Preterm 06/26/09 5575w5d   [name] Vag-Spont   FD  3 Term 11/02/07    F Vag-Spont   Y  2 Term 12/04/05    F Vag-Spont   Y  1 Gravida            Comments: System Generated. Please review and update pregnancy details.    Patient denies any other pertinent gynecologic issues.   Current Outpatient Prescriptions on File Prior to Visit  Medication Sig Dispense Refill  . acetaminophen (TYLENOL) 325 MG tablet Take 650 mg by mouth every 6 (six) hours as needed. pain    . HYDROcodone-acetaminophen (NORCO/VICODIN) 5-325 MG per tablet Take 1 tablet by mouth every 6 (six) hours as needed. 30 tablet 0  . megestrol (MEGACE) 40 MG tablet Take 1 tablet (40 mg total) by mouth daily. 30 tablet 2  . Prenatal Vit-Fe Fumarate-FA (PRENATAL MULTIVITAMIN) TABS Take 1 tablet by mouth daily.     No current facility-administered medications on file prior to visit.   Allergies  Allergen Reactions  . Bee Pollen Hives    Allergic to bee's    Social History:  reports that she quit smoking about 10 years ago. Her smoking use included Cigarettes. She  has never used smokeless tobacco. She reports that she does not drink alcohol or use illicit drugs.  Family History  Problem Relation Age of Onset  . Hypertension Mother   . Blindness Mother   . Cancer Mother     ovarian  . Diabetes Maternal Aunt   . Diabetes Maternal Uncle   . Hypertension Maternal Uncle   . Stroke Maternal Uncle   . Emphysema Maternal Uncle   . Diabetes Maternal Grandmother   . Cancer Maternal Grandmother     breast, ovarian  . Cancer Maternal Grandfather   . Diabetes Maternal Aunt   . Cancer Maternal Aunt     breast  .  Hypertension Maternal Aunt   . Hypertension Maternal Uncle   . Hypertension Maternal Uncle   . Hypertension Daughter   . Other Daughter     renal blockage  . Other Son     stillborn  . Emphysema Father     Review of Systems: Noncontributory  PHYSICAL EXAM: Blood pressure 120/70, height  (1.575 m), weight 162 lb (73.483 kg), last menstrual period 05/28/2015. General appearance - alert, well appearing, and in no distress Chest - clear to auscultation, no wheezes, rales or rhonchi, symmetric air entry Heart - normal rate and regular rhythm Abdomen - soft, nontender, nondistended, no masses or organomegaly Pelvic - normal external genitalia, multiparous introitus. Easy access to uterus. 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 Anterior uterus that appears normal size Rectal: small rectocele to greater than 90 degrees, thin perineal body. Intact anal sphincter Extremities - peripheral pulses normal, no pedal edema, no clubbing or cyanosis  Labs: CBC Latest Ref Rng 07/16/2015 03/11/2015 08/01/2012  WBC 4.0 - 10.5 K/uL 9.0 7.5 10.5  Hemoglobin 12.0 - 15.0 g/dL 16.1 - 11.4(L)  Hematocrit 36.0 - 46.0 % 43.2 43.2 35.0(L)  Platelets 150 - 400 K/uL 175 - 168   BMET    Component Value Date/Time   NA 140 07/16/2015 1505   NA 140 03/11/2015 1644   K 4.4 07/16/2015 1505   CL 105 07/16/2015 1505   CO2 28 07/16/2015 1505   GLUCOSE 87 07/16/2015 1505   GLUCOSE 74 03/11/2015 1644   BUN 11 07/16/2015 1505   BUN 8 03/11/2015 1644   CREATININE 0.71 07/16/2015 1505   CALCIUM 9.7 07/16/2015 1505   GFRNONAA >60 07/16/2015 1505   GFRAA >60 07/16/2015 1505         Imaging Studies:        Pap smear normal in 2015, considered current. Assessment: Patient Active Problem List   Diagnosis Date Noted  . Pelvic pain in female 03/11/2015  . Irregular intermenstrual bleeding 03/11/2015  . PID (pelvic inflammatory disease) 03/11/2015  . Menorrhagia  with regular cycle 11/25/2014  . Dysmenorrhea 11/25/2014  . Contraceptive management 11/07/2013    Plan: Patient will undergo surgical management with vaginal hysterectomy with posterior repair

## 2015-07-21 NOTE — Brief Op Note (Signed)
07/21/2015  10:12 AM  PATIENT:  Norma Vaughn  29 y.o. female  PRE-OPERATIVE DIAGNOSIS:  MENORRHAGIA rectocele  POST-OPERATIVE DIAGNOSIS:  MENORRHAGIA rectocele  PROCEDURE:  Procedure(s): HYSTERECTOMY VAGINAL (N/A) POSTERIOR REPAIR (RECTOCELE) (N/A)  SURGEON:  Surgeon(s) and Role:    * Tilda BurrowJohn Adrie Picking V, MD - Primary  PHYSICIAN ASSISTANT:   ASSISTANTS: [place] RN   ANESTHESIA:   local and general  EBL:  Total I/O In: 2300 [I.V.:2300] Out: 125 [Blood:125]  BLOOD ADMINISTERED:none  DRAINS: Urinary Catheter (Foley) and Vaginal pack   LOCAL MEDICATIONS USED:  MARCAINE   30 cc  SPECIMEN:  Source of Specimen:  Uterus and cervix  DISPOSITION OF SPECIMEN:  PATHOLOGY  COUNTS:  YES  TOURNIQUET:  * No tourniquets in log *  DICTATION: .Dragon Dictation  PLAN OF CARE: Admit for overnight observation  PATIENT DISPOSITION:  PACU - hemodynamically stable.   Delay start of Pharmacological VTE agent (>24hrs) due to surgical blood loss or risk of bleeding: not applicable

## 2015-07-21 NOTE — Transfer of Care (Signed)
Immediate Anesthesia Transfer of Care Note  Patient: Norma Vaughn  Procedure(s) Performed: Procedure(s): HYSTERECTOMY VAGINAL (N/A) POSTERIOR REPAIR (RECTOCELE) (N/A)  Patient Location: PACU  Anesthesia Type:General  Level of Consciousness: awake, oriented and patient cooperative  Airway & Oxygen Therapy: Patient Spontanous Breathing and Patient connected to face mask oxygen  Post-op Assessment: Report given to RN and Post -op Vital signs reviewed and stable  Post vital signs: Reviewed and stable  Last Vitals:  Filed Vitals:   07/21/15 0700 07/21/15 0715  BP: 77/49 103/70  Pulse:    Temp:    Resp: 21 21    Complications: No apparent anesthesia complications

## 2015-07-22 ENCOUNTER — Encounter (HOSPITAL_COMMUNITY): Payer: Self-pay | Admitting: Obstetrics and Gynecology

## 2015-07-22 DIAGNOSIS — N920 Excessive and frequent menstruation with regular cycle: Secondary | ICD-10-CM | POA: Diagnosis not present

## 2015-07-22 LAB — BASIC METABOLIC PANEL
ANION GAP: 3 — AB (ref 5–15)
BUN: 7 mg/dL (ref 6–20)
CALCIUM: 7.9 mg/dL — AB (ref 8.9–10.3)
CO2: 27 mmol/L (ref 22–32)
Chloride: 110 mmol/L (ref 101–111)
Creatinine, Ser: 0.68 mg/dL (ref 0.44–1.00)
GFR calc Af Amer: >60 mL/min (ref >60)
GFR calc non Af Amer: >60 mL/min (ref >60)
GLUCOSE: 116 mg/dL — AB (ref 65–99)
Potassium: 3.8 mmol/L (ref 3.5–5.1)
Sodium: 140 mmol/L (ref 135–145)

## 2015-07-22 LAB — CBC
HEMATOCRIT: 33.9 % — AB (ref 36.0–46.0)
Hemoglobin: 11.4 g/dL — ABNORMAL LOW (ref 12.0–15.0)
MCH: 31.7 pg (ref 26.0–34.0)
MCHC: 33.6 g/dL (ref 30.0–36.0)
MCV: 94.2 fL (ref 78.0–100.0)
PLATELETS: 141 10*3/uL — AB (ref 150–400)
RBC: 3.6 MIL/uL — ABNORMAL LOW (ref 3.87–5.11)
RDW: 12.7 % (ref 11.5–15.5)
WBC: 14.2 10*3/uL — ABNORMAL HIGH (ref 4.0–10.5)

## 2015-07-22 MED ORDER — OXYCODONE-ACETAMINOPHEN 5-325 MG PO TABS: 1.0000 | ORAL_TABLET | ORAL | Status: DC | PRN

## 2015-07-22 MED ORDER — IBUPROFEN 600 MG PO TABS: 600.0000 mg | ORAL_TABLET | Freq: Four times a day (QID) | ORAL | Status: AC | PRN

## 2015-07-22 MED ORDER — POLYETHYLENE GLYCOL 3350 17 GM/SCOOP PO POWD: 17.0000 g | Freq: Every day | ORAL | Status: AC

## 2015-07-22 NOTE — Progress Notes (Signed)
23 ml of Hydromorphone PCA wasted in sink with Dagoberto LigasJessica Mays, RN as witness.

## 2015-07-22 NOTE — Discharge Summary (Signed)
Physician Discharge Summary  Patient ID: Norma Vaughn MRN: [id number] DOB/AGE: 24-Nov-1985 29 y.o.  Admit date: 07/21/2015 Discharge date: 07/22/2015  Admission Diagnoses:menorrhagia, rectocele  Discharge Diagnoses: menorrhagia, rectocele Active Problems:   Status post vaginal hysterectomy posterior repair  Discharged Condition: good  Hospital Course: 29 year old female was admitted for vaginal hysterectomy and posterior repair after bowel prep with good hemoglobin and crit overall general health. She underwent vaginal hysterectomy posterior repair notable for 150 cc blood loss and a diffuse tendency to ooze and the tear repair which responded to vaginal packing and attention to hemostasis. Postoperatively she was able to tolerate removal of vaginal packing and catheter at 8 hours postop had pain levels that she described as 1 or 2 through the night and was able to void, tolerated dinner and breakfast and was discharged home on postop day 1 in stable condition  Consults: None  Significant Diagnostic Studies: labs:  CBC Latest Ref Rng 07/16/2015 03/11/2015 08/01/2012  WBC 4.0 - 10.5 K/uL 9.0 7.5 10.5  Hemoglobin 12.0 - 15.0 g/dL 13.014.6 - 11.4(L)  Hematocrit 36.0 - 46.0 % 43.2 43.2 35.0(L)  Platelets 150 - 400 K/uL 175 - 168    Postoperative hemoglobin still pending   Treatments: surgery: Vaginal hysterectomy posterior repair  Discharge Exam: Blood pressure 103/50, pulse 96, temperature 98.6 F (37 C), temperature source Oral, resp. rate 21, height 5\' 2"  (1.575 m), weight 163 lb (73.936 kg), last menstrual period 05/28/2015, SpO2 97 %. General appearance: alert, cooperative and no distress Head: Normocephalic, without obvious abnormality, atraumatic Eyes: conjunctivae/corneas clear. PERRL, EOM's intact. Fundi benign. Resp: clear to auscultation bilaterally GI: soft, non-tender; bowel sounds normal; no masses,  no organomegaly Extremities: extremities normal, atraumatic, no  cyanosis or edema  Disposition: 01-Home or Self Care  Discharge Instructions    Call MD for:  persistant nausea and vomiting    Complete by:  As directed      Call MD for:  severe uncontrolled pain    Complete by:  As directed      Call MD for:  temperature >100.4    Complete by:  As directed      Diet - low sodium heart healthy    Complete by:  As directed      Increase activity slowly    Complete by:  As directed             Medication List    STOP taking these medications        acetaminophen 325 MG tablet  Commonly known as:  TYLENOL     HYDROcodone-acetaminophen 5-325 MG tablet  Commonly known as:  NORCO/VICODIN      TAKE these medications        ibuprofen 600 MG tablet  Commonly known as:  ADVIL,MOTRIN  Take 1 tablet (600 mg total) by mouth every 6 (six) hours as needed (mild pain).     oxyCODONE-acetaminophen 5-325 MG tablet  Commonly known as:  PERCOCET/ROXICET  Take 1-2 tablets by mouth every 4 (four) hours as needed for severe pain (moderate to severe pain (when tolerating fluids)).     polyethylene glycol powder powder  Commonly known as:  MIRALAX  Take 17 g by mouth daily. To prevent constipation     prenatal multivitamin Tabs tablet  Take 1 tablet by mouth daily.           Follow-up Information    Follow up with Norma BurrowFERGUSON,Artelia Game V, MD In 2 weeks.   Specialties:  Obstetrics and Gynecology, Radiology  Why:  Postoperative visit   Contact information:   [address] [place] [address] [phone]       Signed: Tilda Vaughn 07/22/2015, 7:17 AM

## 2015-07-22 NOTE — Progress Notes (Signed)
AVS reviewed with patient and patient's husband this morning.  Verbalized understanding of discharge instructions, physician follow-up, medications.  Prescriptions (3) provided to patient.  Patient's IV removed.  Site WNL.  Patient reports all belongings intact and in possession at time of discharge.  Patient transported by NT via w/c to main entrance for discharge.

## 2015-07-22 NOTE — Discharge Instructions (Signed)
Hysterectomy, Care After.  While these instructions are for abdominal hysterectomy, most details are the same.  Refer to this sheet in the next few weeks. These instructions provide you with information on caring for yourself after your procedure. Your health care provider may also give you more specific instructions. Your treatment has been planned according to current medical practices, but problems sometimes occur. Call your health care provider if you have any problems or questions after your procedure.  WHAT TO EXPECT AFTER THE PROCEDURE After your procedure, it is typical to have the following:  Pain.  Feeling tired.  Poor appetite.  Less interest in sex. It takes 4-6 weeks to recover from this surgery.  HOME CARE INSTRUCTIONS   Take pain medicines only as directed by your health care provider. Do not take over-the-counter pain medicines without checking with your health care provider first.  Change your bandage as directed by your health care provider.  Return to your health care provider to have your sutures taken out.  Take showers instead of baths for 2-3 weeks. Ask your health care provider when it is safe to start showering.  Do not douche, use tampons, or have sexual intercourse for at least 6 weeks or until your health care provider says you can.   Follow your health care provider's advice about exercise, lifting, driving, and general activities.  Get plenty of rest and sleep.   Do not lift anything heavier than a gallon of milk (about 10 lb [4.5 kg]) for the first month after surgery.  You can resume your normal diet if your health care provider says it is okay.   Do not drink alcohol until your health care provider says you can.   If you are constipated, ask your health care provider if you can take a mild laxative.  Eating foods high in fiber may also help with constipation. Eat plenty of raw fruits and vegetables, whole grains, and beans.  Drink  enough fluids to keep your urine clear or pale yellow.   Try to have someone at home with you for the first 1-2 weeks to help around the house.  Keep all follow-up appointments. SEEK MEDICAL CARE IF:   You have chills or fever.  You have swelling, redness, or pain in the area of your incision that is getting worse.   You have pus coming from the incision.   You notice a bad smell coming from the incision or bandage.   Your incision breaks open.   You feel dizzy or light-headed.   You have pain or bleeding when you urinate.   You have persistent diarrhea.   You have persistent nausea and vomiting.   You have abnormal vaginal discharge.   You have a rash.   You have any type of abnormal reaction or develop an allergy to your medicine.   Your pain medicine is not helping.  SEEK IMMEDIATE MEDICAL CARE IF:   You have a fever and your symptoms suddenly get worse.  You have severe abdominal pain.  You have chest pain.  You have shortness of breath.  You faint.  You have pain, swelling, or redness of your leg.  You have heavy vaginal bleeding with blood clots. MAKE SURE YOU:  Understand these instructions.  Will watch your condition.  Will get help right away if you are not doing well or get worse.   This information is not intended to replace advice given to you by your health care provider. Make sure you  discuss any questions you have with your health care provider.   Document Released: 03/04/2005 Document Revised: 09/05/2014 Document Reviewed: 06/07/2013 Elsevier Interactive Patient Education 2016 Elsevier Inc. Anterior and Posterior Colporrhaphy Anterior or posterior colporrhaphy is surgery to fix a prolapse of organs in the genital tract. Prolapse means the falling down, bulging, dropping, or drooping of an organ. Organs that commonly prolapse include the rectum, bladder, vagina, and uterus. Prolapse can affect a single organ or several organs at  the same time. This often worsens when women stop having their monthly periods (menopause) because estrogen loss weakens the muscles and tissues in the genital tract. In addition, prolapse happens when the organs are damaged or weakened. This commonly happens after childbirth and as a result of aging. Surgery is often done for severe prolapses.  The type of colporrhaphy done depends on the type of genital prolapse. Types of genital prolapse include the following:   Cystocele. This is a prolapse of the upper (anterior) wall of the vagina. The anterior wall bulges into the vagina and brings the bladder with it.   Rectocele. This is a prolapse of the lower (posterior) wall of the vagina. The posterior vaginal wall bulges into the vagina and brings the rectum with it.   Enterocele. This is a prolapse of part of the pelvic organs called the pouch of Douglas. It also involves a portion of the small bowel. It appears as a bulge under the neck of the uterus at the top of the back wall of the vagina.   Procidentia. This is a complete prolapse of the uterus and the cervix. The prolapse can be seen and felt coming out of the vagina. LET [facility] CARE PROVIDER KNOW ABOUT:   Any allergies you have.   All medicines you are taking, including vitamins, herbs, eye drops, creams, and over-the-counter medicines.   Previous problems you or members of your family have had with the use of anesthetics.   Any blood disorders you have.   Previous surgeries you have had.   Medical conditions you have.   Smoking history or history of alcohol use.   Possibility of pregnancy, if this applies.  RISKS AND COMPLICATIONS Generally, anterior or posterior colporrhaphy is a safe procedure. However, as with any procedure, complications can occur. Possible complications include:   Infection.   Damage to other organs during surgery.   Bleeding after surgery.   Problems urinating.   Problems from the  anesthetic.  BEFORE THE PROCEDURE  Ask your health care provider about changing or stopping your regular medicines.   Do not eat or drink anything for at least 8 hours before the surgery.   If you smoke, do not smoke for at least 2 weeks before the surgery.   Make plans to have someone drive you home after your hospital stay. Also, arrange for someone to help you with activities during recovery. PROCEDURE  You may be given medicine to help you relax before the surgery (sedative). During the surgery you will be given medicine to make you sleep through the procedure (general anesthetic) or medicine to numb you from the waist down (spinal anesthetic). This medicine will be given through an intravenous (IV) access tube that is put into one of your veins.  The procedure will vary depending on the type of repair:   Anterior repair. A cut (incision) is made in the midline section of the front part of the vaginal wall. A triangular-shaped piece of vaginal tissue is removed, and the  stronger, healthier tissue is sewn together in order to support and suspend the bladder.   Posterior repair. An incision is made midline on the back wall of the vagina. A triangular portion of vaginal skin is removed to expose the muscle. Excess tissue is removed, and stronger, healthier muscle and ligament tissue is sewn together to support the rectum.   Anterior and posterior repair. Both procedures are done during the same surgery. AFTER THE PROCEDURE You will be taken to a recovery area. Your blood pressure, pulse, breathing, and temperature (vital signs) will be monitored. This is done until you are stable. Then you will be transferred to a hospital room.  After surgery, you will have a small rubber tube in place to drain your bladder (urinary catheter). This will be in place for 2 to 7 days or until your bladder is working properly on its own. The IV access tube will be removed in 1 to 3 days. You may have a gauze  packing in your vagina to prevent bleeding. This will be removed 2 or 3 days after the surgery. You will likely need to stay in the hospital for overnite.    This information is not intended to replace advice given to you by your health care provider. Make sure you discuss any questions you have with your health care provider.   Document Released: 11/05/2003 Document Revised: 04/17/2013 Document Reviewed: 01/04/2013 Elsevier Interactive Patient Education [employer]2016 Elsevier Inc.

## 2015-07-28 ENCOUNTER — Ambulatory Visit (INDEPENDENT_AMBULATORY_CARE_PROVIDER_SITE_OTHER): Payer: Medicaid Other | Admitting: Obstetrics and Gynecology

## 2015-07-28 ENCOUNTER — Encounter: Payer: Self-pay | Admitting: Obstetrics and Gynecology

## 2015-07-28 VITALS — BP 90/60 | Ht 62.0 in | Wt 162.0 lb

## 2015-07-28 DIAGNOSIS — Z9889 Other specified postprocedural states: Secondary | ICD-10-CM

## 2015-07-28 DIAGNOSIS — Z09 Encounter for follow-up examination after completed treatment for conditions other than malignant neoplasm: Secondary | ICD-10-CM

## 2015-07-28 NOTE — Progress Notes (Signed)
Patient ID: Norma Vaughn Danese, female   DOB: 24-Oct-1985, 29 y.o.   MRN: [id number] Pt here today for post op visit. Pt is one week postop, hysterectomy and posterior repair. Pt states that she was hoping that she would bounce back quickly but she has had more soreness and discomfort than she thought she would.     Subjective:  Norma Vaughn Ruby is a 29 y.o.    Review of Systems Negative except TENDER WITH BM   Diet:   reg   Bowel movements : normal. Loose, but pt still nervous and has trouble relaxing, afraid it will hurt  Pain is controlled with current analgesics. Medications being used: ibuprofen (OTC) and narcotic analgesics including occasional percocet.  Objective:  BP 90/60 mmHg  Ht 5\' 2"  (1.575 m)  Wt 162 lb (73.483 kg)  BMI 29.62 kg/m2  LMP 06/18/2015 General:Well developed, well nourished.  No acute distress. Abdomen: Bowel sounds normal, soft, non-tender. Pelvic Exam:    External Genitalia:  Normal.    Vagina: Normal    Cervix: Normalabsent    Uterus: Normalabsent    Adnexa/Bimanual: Normal appropriate tenderness and fullness   Incision(s):   Healing well, no drainage, no erythema, no hernia, no swelling, no dehiscence,     Assessment:  Post-Op 1 weeks s/p vaginal hysterectomy and posterior colporrhaphy     Doing well postoperatively.   Plan:  1.Wound care discussed   2. . current medications.ibuprofen, occas percocet 3. Activity restrictions: no stairs and no driving x 1 more wk 4. return to work: not applicable. 5. Follow up in 4 week.

## 2015-08-13 ENCOUNTER — Telehealth: Payer: Self-pay | Admitting: *Deleted

## 2015-08-13 NOTE — Telephone Encounter (Signed)
Pt states that two stitches have came out yesterday. Pt states that she has been sneezing and coughing really bad and sfter she had the sneezing and coughing she had 2 stitches come out. Pt states that she did have some pain but she has not had any bleeding at all.

## 2015-08-14 NOTE — Telephone Encounter (Signed)
Pt states that she has not had any bleeding or pain. I advised the pt that as long as she was not having pain or bleeding that she should be fine to wait for her appointment but that if anything changes she needs to either go to the ER or call our office. Pt verbalized understanding.

## 2015-08-26 ENCOUNTER — Encounter: Payer: Medicaid Other | Admitting: Obstetrics and Gynecology

## 2015-08-27 ENCOUNTER — Encounter: Payer: Self-pay | Admitting: Obstetrics and Gynecology

## 2015-08-27 ENCOUNTER — Ambulatory Visit (INDEPENDENT_AMBULATORY_CARE_PROVIDER_SITE_OTHER): Payer: Medicaid Other | Admitting: Obstetrics and Gynecology

## 2015-08-27 VITALS — BP 120/70 | HR 68 | Ht 62.0 in | Wt 162.8 lb

## 2015-08-27 DIAGNOSIS — Z4889 Encounter for other specified surgical aftercare: Secondary | ICD-10-CM

## 2015-08-27 DIAGNOSIS — Z90710 Acquired absence of both cervix and uterus: Secondary | ICD-10-CM

## 2015-08-27 NOTE — Progress Notes (Signed)
Patient ID: Norma Vaughn, female   DOB: 07-21-1986, 29 y.o.   MRN: [id number]  Subjective:  Norma Vaughn is a 29 y.o. female now 5 weeks status post vaginal hysterectomy and posterior colporrhaphy .   Pt reports no pain at her incision site and normal activity s/p procedure.   Review of Systems Negative   Diet:   normal   Bowel movements : normal.  The patient is not having any pain.  Objective:  BP 120/70 mmHg  Pulse 68  Ht 5\' 2"  (1.575 m)  Wt 162 lb 12.8 oz (73.846 kg)  BMI 29.77 kg/m2  LMP 06/18/2015 General:Well developed, well nourished.  No acute distress. Abdomen: Bowel sounds normal, soft, non-tender. Pelvic Exam:    External Genitalia:  Normal.    Vagina: Normal    Cervix: Absent; removed surgically     Uterus: absent; removed surgically     Adnexa/Bimanual: Normal  Incision(s):   Healing well, no drainage, no erythema, no hernia, no swelling, no dehiscence,    Assessment:  Post-Op 5 weeks s/p vaginal hysterectomy and posterior colporrhaphy   Well-healing incision  Silver nitrate applied to skin tag of granulation tissue on surgical cuff..   Doing well postoperatively.   Plan:  1.Wound care discussed   2. . current medications. none 3. Activity restrictions: none 4. return to work: not applicable. 5. Follow up in prn .Marland Kitchen. 6. Advised pt to exercise caution with intercourse while healing.    By signing my name below, I, Doreatha MartinEva Mathews, attest that this documentation has been prepared under the direction and in the presence of Tilda BurrowJohn Mohamedamin Nifong V, MD. Electronically Signed: Doreatha MartinEva Mathews, ED Scribe. 08/27/2015. 3:35 PM.  I personally performed the services described in this documentation, which was SCRIBED in my presence. The recorded information has been reviewed and considered accurate. It has been edited as necessary during review. Tilda BurrowFERGUSON,Braylinn Gulden V, MD

## 2016-04-15 ENCOUNTER — Telehealth: Payer: Self-pay | Admitting: Obstetrics and Gynecology

## 2016-04-18 NOTE — Telephone Encounter (Signed)
Pt c/o of a lot of pain in right side and spasm had surgery in November with Dr.Ferguson. Pt given appt for evaluation

## 2016-04-22 ENCOUNTER — Ambulatory Visit (INDEPENDENT_AMBULATORY_CARE_PROVIDER_SITE_OTHER): Payer: Medicaid Other | Admitting: Obstetrics and Gynecology

## 2016-04-22 ENCOUNTER — Encounter: Payer: Self-pay | Admitting: Obstetrics and Gynecology

## 2016-04-22 VITALS — BP 132/78 | HR 68 | Wt 170.2 lb

## 2016-04-22 DIAGNOSIS — R102 Pelvic and perineal pain: Secondary | ICD-10-CM | POA: Diagnosis not present

## 2016-04-22 NOTE — Progress Notes (Signed)
[facility] Visit  .04/22/16           Patient name: Norma Vaughn MRN [id number]  Date of birth: 23-Mar-1986  CC & HPI:  Norma Vaughn is a 30 y.o. female presenting today for intermittent, moderate, right lower abdominal pain x ~4 months. She reports associated dyspareunia x 4 months. Pt had vaginal hysterectomy in November 2016 and states she was fine up until May 2017. She has taken ibuprofen 600 mg with mild relief and has also been sitting in warm baths with some relief.   ROS:  ROS 10 systems reviewed and all are negative for acute change except as noted in the HPI.  Pertinent History Reviewed:   Reviewed: Significant for vaginal hysterectomy and PID Medical         Past Medical History:  Diagnosis Date  . Anemia   . Contraceptive management 11/07/2013  . Dysmenorrhea 11/25/2014  . Glaucoma   . Irregular intermenstrual bleeding 03/11/2015  . Menorrhagia with regular cycle 11/25/2014  . Migraines   . No pertinent past medical history   . Pelvic pain in female 03/11/2015  . PID (pelvic inflammatory disease) 03/11/2015                              Surgical Hx:    Past Surgical History:  Procedure Laterality Date  . CESAREAN SECTION  07/31/2012   Procedure: CESAREAN SECTION;  Surgeon: Willodean Rosenthalarolyn Harraway-Smith, MD;  Location: [facility];  Service: Gynecology;  Laterality: N/A;  primary  . DILATION AND CURETTAGE OF UTERUS    . RECTOCELE REPAIR N/A 07/21/2015   Procedure: POSTERIOR REPAIR (RECTOCELE);  Surgeon: Tilda BurrowJohn Zaryia Markel V, MD;  Location: [facility];  Service: Gynecology;  Laterality: N/A;  . VAGINAL HYSTERECTOMY N/A 07/21/2015   Procedure: HYSTERECTOMY VAGINAL;  Surgeon: Tilda BurrowJohn Umair Rosiles V, MD;  Location: [facility];  Service: Gynecology;  Laterality: N/A;   Medications: Reviewed & Updated - see associated section                       Current Outpatient Prescriptions:  .  ibuprofen (ADVIL,MOTRIN) 600 MG tablet, Take 1 tablet (600 mg total) by mouth every 6 (six) hours as  needed (mild pain)., Disp: 30 tablet, Rfl: 0 .  oxyCODONE-acetaminophen (PERCOCET/ROXICET) 5-325 MG tablet, Take 1-2 tablets by mouth every 4 (four) hours as needed for severe pain (moderate to severe pain (when tolerating fluids))., Disp: 30 tablet, Rfl: 0 .  polyethylene glycol powder (MIRALAX) powder, Take 17 g by mouth daily. To prevent constipation, Disp: 255 g, Rfl: prn   Social History: Reviewed -  reports that she quit smoking about 11 years ago. Her smoking use included Cigarettes. She has a 5.00 pack-year smoking history. She has never used smokeless tobacco.  Objective Findings:  Vitals: Blood pressure 132/78, pulse 68, weight 170 lb 3.2 oz (77.2 kg), last menstrual period 06/18/2015.  Physical Examination: General appearance - alert, well appearing, and in no distress and oriented to person, place, and time Mental status - normal mood, behavior, speech, dress, motor activity, and thought processes Abdomen - soft, nontender, nondistended, no masses or organomegaly Pelvic -  VULVA: normal appearing vulva with no masses, tenderness or lesions,  VAGINA: normal appearing vagina with normal color and discharge, no lesions, cuff well healed and smooth CERVIX: Surgically removed  UTERUS: surgically removed  ADNEXA/Bimanual: normal adnexa in size, nontender and no masses; cuff mildly sensitive  to palpation   Assessment & Plan:   A:  1. RLQ pelvic pain  2. Dyspareunia  3 s/p TVH 2016 c  Initially excellent recovery P:  1. Transvaginal US within the next 1-2 weeks to r/o ovarian abnormalities  By signing my name below, I, [name], attest that this documentation has been prepared under the direction and in the presence of Tilda Burrow, MD . Electronically Signed: [name], Scribe. 04/22/2016. 1:50 PM. I personally performed the services described in this documentation, which was SCRIBED in my presence. The recorded information has been reviewed and considered accurate. It  has been edited as necessary during review. Tilda Burrow, MD

## 2016-05-03 ENCOUNTER — Other Ambulatory Visit: Payer: Self-pay | Admitting: Obstetrics and Gynecology

## 2016-05-03 DIAGNOSIS — R1031 Right lower quadrant pain: Secondary | ICD-10-CM

## 2016-05-06 ENCOUNTER — Ambulatory Visit (INDEPENDENT_AMBULATORY_CARE_PROVIDER_SITE_OTHER): Payer: Medicaid Other

## 2016-05-06 ENCOUNTER — Other Ambulatory Visit: Payer: Self-pay | Admitting: Obstetrics and Gynecology

## 2016-05-06 DIAGNOSIS — R1031 Right lower quadrant pain: Secondary | ICD-10-CM

## 2016-05-06 DIAGNOSIS — N83291 Other ovarian cyst, right side: Secondary | ICD-10-CM

## 2016-05-06 MED ORDER — NORGESTIMATE-ETH ESTRADIOL 0.25-35 MG-MCG PO TABS: 1.0000 | ORAL_TABLET | Freq: Every day | ORAL | 11 refills | Status: DC

## 2016-05-06 NOTE — Progress Notes (Signed)
PELVIC US TA/TV: Normal vag cuff,normal ov's bilat,both ov's appear mobile,no free fluid seen,pain during [employer]ound,Dr Ferguson reviewed ultrasound during exam.

## 2016-05-06 NOTE — Progress Notes (Signed)
Pt having pain from right ovary that has multiple small follicular cysts and is located above the cuff of vagina on right. The + sliding organ sign suggests ovary to be mobile. Will suppress with Sprintec x 1 yr . Reassess in 2-3 mos.

## 2016-05-23 ENCOUNTER — Telehealth: Payer: Self-pay | Admitting: *Deleted

## 2016-05-23 NOTE — Telephone Encounter (Signed)
Pt called stating that she is returning Tish's phone call

## 2016-05-23 NOTE — Telephone Encounter (Signed)
[id number]

## 2016-05-23 NOTE — Telephone Encounter (Signed)
Spoke with patient who states she has had a fever on and off since last Wednesday with bilateral breast soreness in which she is unable to tolerate wearing a bra. She is concerned it may be the birth controls pills prescribed by Dr Emelda FearFerguson 2 weeks ago to help decrease swelling of her ovaries. Appt scheduled for evaluation tomorrow at 2:20 with Dr Emelda FearFerguson but instructed patient to go to emergency room or urgent care if she feels like symptoms are worsening.

## 2016-05-23 NOTE — Telephone Encounter (Signed)
Duplicate message. 

## 2016-05-24 ENCOUNTER — Ambulatory Visit (INDEPENDENT_AMBULATORY_CARE_PROVIDER_SITE_OTHER): Payer: Medicaid Other | Admitting: Obstetrics and Gynecology

## 2016-05-24 ENCOUNTER — Encounter: Payer: Self-pay | Admitting: Obstetrics and Gynecology

## 2016-05-24 VITALS — BP 98/66 | HR 68 | Ht 62.0 in | Wt 169.4 lb

## 2016-05-24 DIAGNOSIS — N644 Mastodynia: Secondary | ICD-10-CM | POA: Diagnosis not present

## 2016-05-24 MED ORDER — GABAPENTIN 300 MG PO CAPS: 300.0000 mg | ORAL_CAPSULE | Freq: Three times a day (TID) | ORAL | 1 refills | Status: AC

## 2016-05-24 NOTE — Progress Notes (Addendum)
 [facility] Visit  05/24/16            Patient name: Norma Vaughn MRN [id number]  Date of birth: 06/24/1986  CC & HPI:  Norma Vaughn is a 30 y.o. female presenting today for constant, unchanged bilateral symmetric breast pain that began ~ 2 weeks ago that is worse with sitting down. She states the pain is worse with wearing a bra or tight shirts. She denies redness, swelling, mass. This is the 3rd week of her cycle. She was started on Sprintec around the same time the pain began is concerned it might be related to the medication.   ROS:  ROS +breast pain A complete 10 system review of systems was obtained and all systems are negative except as noted in the HPI and PMH.    Pertinent History Reviewed:   Reviewed: Significant for  Medical         Past Medical History:  Diagnosis Date  . Anemia   . Contraceptive management 11/07/2013  . Dysmenorrhea 11/25/2014  . Glaucoma   . Irregular intermenstrual bleeding 03/11/2015  . Menorrhagia with regular cycle 11/25/2014  . Migraines   . No pertinent past medical history   . Pelvic pain in female 03/11/2015  . PID (pelvic inflammatory disease) 03/11/2015                              Surgical Hx:    Past Surgical History:  Procedure Laterality Date  . CESAREAN SECTION  07/31/2012   Procedure: CESAREAN SECTION;  Surgeon: Carolyn Harraway-Smith, MD;  Location: [facility];  Service: Gynecology;  Laterality: N/A;  primary  . DILATION AND CURETTAGE OF UTERUS    . RECTOCELE REPAIR N/A 07/21/2015   Procedure: POSTERIOR REPAIR (RECTOCELE);  Surgeon: John Ferguson V, MD;  Location: [facility];  Service: Gynecology;  Laterality: N/A;  . VAGINAL HYSTERECTOMY N/A 07/21/2015   Procedure: HYSTERECTOMY VAGINAL;  Surgeon: John Ferguson V, MD;  Location: [facility];  Service: Gynecology;  Laterality: N/A;   Medications: Reviewed & Updated - see associated section                       Current Outpatient Prescriptions:  .  ibuprofen (ADVIL,MOTRIN)  600 MG tablet, Take 1 tablet (600 mg total) by mouth every 6 (six) hours as needed (mild pain)., Disp: 30 tablet, Rfl: 0 .  norgestimate-ethinyl estradiol (ORTHO-CYCLEN,SPRINTEC,PREVIFEM) 0.25-35 MG-MCG tablet, Take 1 tablet by mouth daily., Disp: 1 Package, Rfl: 11 .  oxyCODONE-acetaminophen (PERCOCET/ROXICET) 5-325 MG tablet, Take 1-2 tablets by mouth every 4 (four) hours as needed for severe pain (moderate to severe pain (when tolerating fluids))., Disp: 30 tablet, Rfl: 0 .  polyethylene glycol powder (MIRALAX) powder, Take 17 g by mouth daily. To prevent constipation, Disp: 255 g, Rfl: prn   Social History: Reviewed -  reports that she quit smoking about 11 years ago. Her smoking use included Cigarettes. She has a 5.00 pack-year smoking history. She has never used smokeless tobacco.  Objective Findings:  Vitals: Blood pressure 98/66, pulse 68, height 5' 2" (1.575 m), weight 169 lb 6.4 oz (76.8 kg), last menstrual period 06/18/2015.  Physical Examination: General appearance - alert, well appearing, and in no distress and oriented to person, place, and time Mental status - alert, oriented to person, place, and time, normal mood, behavior, speech, dress, motor activity, and thought processes  The provider spent over 15 minutes   with the visit, including pre visit review, documentation, with >than 50% spent in counseling and coordination of care.  Assessment & Plan:   A:  1. Mastalgia   P:  1. Start on Neurontin 300 mg TID 2. Continue Sprintec    By signing my name below, I, Sonum Patel, attest that this documentation has been prepared under the direction and in the presence of John V Ferguson, MD. Electronically Signed: Sonum Patel, Scribe. 05/24/16. 2:44 PM.  I personally performed the services described in this documentation, which was SCRIBED in my presence. The recorded information has been reviewed and considered accurate. It has been edited as necessary during  review. FERGUSON,JOHN V, MD     

## 2016-05-27 NOTE — Progress Notes (Signed)
[facility] Visit  05/24/16            Patient name: Roise Emert MRN [id number]  Date of birth: 1986-02-04  CC & HPI:  Elba Schaber is a 30 y.o. female presenting today for constant, unchanged bilateral symmetric breast pain that began ~ 2 weeks ago that is worse with sitting down. She states the pain is worse with wearing a bra or tight shirts. She denies redness, swelling, mass. This is the 3rd week of her cycle. She was started on Sprintec around the same time the pain began is concerned it might be related to the medication.   ROS:  ROS +breast pain A complete 10 system review of systems was obtained and all systems are negative except as noted in the HPI and PMH.    Pertinent History Reviewed:   Reviewed: Significant for  Medical         Past Medical History:  Diagnosis Date  . Anemia   . Contraceptive management 11/07/2013  . Dysmenorrhea 11/25/2014  . Glaucoma   . Irregular intermenstrual bleeding 03/11/2015  . Menorrhagia with regular cycle 11/25/2014  . Migraines   . No pertinent past medical history   . Pelvic pain in female 03/11/2015  . PID (pelvic inflammatory disease) 03/11/2015                              Surgical Hx:    Past Surgical History:  Procedure Laterality Date  . CESAREAN SECTION  07/31/2012   Procedure: CESAREAN SECTION;  Surgeon: Willodean Rosenthal, MD;  Location: [facility];  Service: Gynecology;  Laterality: N/A;  primary  . DILATION AND CURETTAGE OF UTERUS    . RECTOCELE REPAIR N/A 07/21/2015   Procedure: POSTERIOR REPAIR (RECTOCELE);  Surgeon: Tilda Burrow, MD;  Location: [facility];  Service: Gynecology;  Laterality: N/A;  . VAGINAL HYSTERECTOMY N/A 07/21/2015   Procedure: HYSTERECTOMY VAGINAL;  Surgeon: Tilda Burrow, MD;  Location: [facility];  Service: Gynecology;  Laterality: N/A;   Medications: Reviewed & Updated - see associated section                       Current Outpatient Prescriptions:  .  ibuprofen (ADVIL,MOTRIN)  600 MG tablet, Take 1 tablet (600 mg total) by mouth every 6 (six) hours as needed (mild pain)., Disp: 30 tablet, Rfl: 0 .  norgestimate-ethinyl estradiol (ORTHO-CYCLEN,SPRINTEC,PREVIFEM) 0.25-35 MG-MCG tablet, Take 1 tablet by mouth daily., Disp: 1 Package, Rfl: 11 .  oxyCODONE-acetaminophen (PERCOCET/ROXICET) 5-325 MG tablet, Take 1-2 tablets by mouth every 4 (four) hours as needed for severe pain (moderate to severe pain (when tolerating fluids))., Disp: 30 tablet, Rfl: 0 .  polyethylene glycol powder (MIRALAX) powder, Take 17 g by mouth daily. To prevent constipation, Disp: 255 g, Rfl: prn   Social History: Reviewed -  reports that she quit smoking about 11 years ago. Her smoking use included Cigarettes. She has a 5.00 pack-year smoking history. She has never used smokeless tobacco.  Objective Findings:  Vitals: Blood pressure 98/66, pulse 68, height 5\' 2"  (1.575 m), weight 169 lb 6.4 oz (76.8 kg), last menstrual period 06/18/2015.  Physical Examination: General appearance - alert, well appearing, and in no distress and oriented to person, place, and time Mental status - alert, oriented to person, place, and time, normal mood, behavior, speech, dress, motor activity, and thought processes  The provider spent over 15 minutes  with the visit, including pre visit review, documentation, with >than 50% spent in counseling and coordination of care.  Assessment & Plan:   A:  1. Mastalgia   P:  1. Start on Neurontin 300 mg TID 2. Continue Sprintec    By signing my name below, I, Sonum Patel, attest that this documentation has been prepared under the direction and in the presence of Tilda BurrowJohn V Margaret Cockerill, MD. Electronically Signed: Sonum Patel, Neurosurgeoncribe. 05/24/16. 2:44 PM.  I personally performed the services described in this documentation, which was SCRIBED in my presence. The recorded information has been reviewed and considered accurate. It has been edited as necessary during  review. Tilda BurrowFERGUSON,Caisley Baxendale V, MD

## 2016-06-23 ENCOUNTER — Ambulatory Visit: Payer: Medicaid Other | Admitting: Obstetrics and Gynecology

## 2016-07-01 ENCOUNTER — Ambulatory Visit: Payer: Medicaid Other | Admitting: Obstetrics and Gynecology

## 2016-08-03 ENCOUNTER — Ambulatory Visit: Payer: Medicaid Other | Admitting: Obstetrics and Gynecology

## 2016-08-11 ENCOUNTER — Encounter: Payer: Self-pay | Admitting: Obstetrics and Gynecology

## 2016-08-11 ENCOUNTER — Ambulatory Visit (INDEPENDENT_AMBULATORY_CARE_PROVIDER_SITE_OTHER): Payer: Medicaid Other | Admitting: Obstetrics and Gynecology

## 2016-08-11 VITALS — BP 120/68 | HR 63 | Ht 62.0 in | Wt 175.0 lb

## 2016-08-11 DIAGNOSIS — N941 Unspecified dyspareunia: Secondary | ICD-10-CM | POA: Diagnosis not present

## 2016-08-11 DIAGNOSIS — R102 Pelvic and perineal pain: Secondary | ICD-10-CM | POA: Diagnosis not present

## 2016-08-11 NOTE — Progress Notes (Signed)
Patient ID: Norma Vaughn Kyllo, female   DOB: 26-Jun-1986, 30 y.o.   MRN: [id number]    [facility]  @DATE @            Patient name: Norma Vaughn Mesina MRN [id number]  Date of birth: 26-Jun-1986  CC & HPI:   Chief Complaint  Patient presents with  . Follow-up    f/u ovarian cyst     Norma Vaughn Kelch is a 30 y.o. female presenting today for f/u of bilateral ovarian cyst.She is now 1 year status post vaginal hysterectomy where uterine contact dyspareunia was a major part of her complaints, along with pelvic relaxation and uterine retroversion Pt states her pain persists and is significantly worsened during intercourse. She reports she was pain free for 6 months after her vaginal hysterectomy in 06/2015, but her pain has now returned and what she describes as a acute onset. She denies any change in vaginal discharge.   ROS:  ROS + bilateral lower abdominal pain  + dyspareunia   Pertinent History Reviewed:   Reviewed: Significant for PID, cesarean section, D&C, vaginal hysterectomy, rectocele repair   Medical         Past Medical History:  Diagnosis Date  . Anemia   . Contraceptive management 11/07/2013  . Dysmenorrhea 11/25/2014  . Glaucoma   . Irregular intermenstrual bleeding 03/11/2015  . Menorrhagia with regular cycle 11/25/2014  . Migraines   . No pertinent past medical history   . Pelvic pain in female 03/11/2015  . PID (pelvic inflammatory disease) 03/11/2015                              Surgical Hx:    Past Surgical History:  Procedure Laterality Date  . CESAREAN SECTION  07/31/2012   Procedure: CESAREAN SECTION;  Surgeon: Willodean Rosenthalarolyn Harraway-Smith, MD;  Location: [facility];  Service: Gynecology;  Laterality: N/A;  primary  . DILATION AND CURETTAGE OF UTERUS    . RECTOCELE REPAIR N/A 07/21/2015   Procedure: POSTERIOR REPAIR (RECTOCELE);  Surgeon: Tilda BurrowJohn Nycere Presley V, MD;  Location: [facility];  Service: Gynecology;  Laterality: N/A;  . VAGINAL HYSTERECTOMY N/A 07/21/2015    Procedure: HYSTERECTOMY VAGINAL;  Surgeon: Tilda BurrowJohn Stedman Summerville V, MD;  Location: [facility];  Service: Gynecology;  Laterality: N/A;   Medications: Reviewed & Updated - see associated section                       Current Outpatient Prescriptions:  .  gabapentin (NEURONTIN) 300 MG capsule, Take 1 capsule (300 mg total) by mouth 3 (three) times daily., Disp: 90 capsule, Rfl: 1 .  ibuprofen (ADVIL,MOTRIN) 600 MG tablet, Take 1 tablet (600 mg total) by mouth every 6 (six) hours as needed (mild pain)., Disp: 30 tablet, Rfl: 0 .  norgestimate-ethinyl estradiol (ORTHO-CYCLEN,SPRINTEC,PREVIFEM) 0.25-35 MG-MCG tablet, Take 1 tablet by mouth daily., Disp: 1 Package, Rfl: 11 .  oxyCODONE-acetaminophen (PERCOCET/ROXICET) 5-325 MG tablet, Take 1-2 tablets by mouth every 4 (four) hours as needed for severe pain (moderate to severe pain (when tolerating fluids))., Disp: 30 tablet, Rfl: 0 .  polyethylene glycol powder (MIRALAX) powder, Take 17 g by mouth daily. To prevent constipation, Disp: 255 g, Rfl: prn   Social History: Reviewed -  reports that she quit smoking about 11 years ago. Her smoking use included Cigarettes. She has a 5.00 pack-year smoking history. She has never used smokeless tobacco.  Objective Findings:  Vitals: Blood pressure  120/68, pulse 63, height 5\' 2"  (1.575 m), weight 175 lb (79.4 kg), last menstrual period 06/18/2015.  Physical Examination: General appearance - alert, well appearing, and in no distress Mental status - alert, oriented to person, place, and time Abdomen - soft, nontender, nondistended, no masses or organomegaly. Some discomfort to the abdominal wall on bimanual exam, but "I can handle that"  Pelvic -  VULVA: normal appearing vulva with no masses, tenderness or lesions,  VAGINA: normal appearing vagina with normal color and discharge, no lesions. Generous vaginal discharge that is normal in appearance, excellent vaginal length and support. Vaginal sidewall Mild pain with  palpation. Vaginal cuff well supported, tender to contact, vague sense of fullness above raising question of bowel vs adhesions.   ADNEXA: normal adnexa in size, no masses.  Cervix and uterus surgically removed   Wet prep collected: normal epithelial's, no clue, no tric  KOH collected: negative   Transvaginal US 05/06/16 Uterus                    Surgically absent,normal vag cuff  Right ovary             3.6 x 2.8 x 3 cm, wnl  Left ovary                3.2 x 2.3 x 2.5 cm, wnl  No free fluid,pelvic pain during ultrasound,ovaries appear mobile.  Technician Comments:  PELVIC US TA/TV: Normal vag cuff,normal ov's bilat,both ov's appear mobile,no free fluid seen,pain during [employer]ultrasound,Dr Delsin Copen reviewed ultrasound during exam.  Discussion: 1. Discussed with pt risks and benefits of laparoscopy for possible adhesiolysis.   At end of discussion, pt had opportunity to ask questions and has no further questions at this time.   Specific discussion of laparoscopy as noted above. Greater than 50% was spent in counseling and coordination of care with the patient.   Total time greater than: 25 minutes.    Assessment & Plan:   A:  1. Persistent lower abdominal pain [facility] with initially excellent recovery   3. Ovarian abnormalities r/o per US in 04/2016   P:  1. F/u in 4 weeks or after 1st of year for pre-op exam (laparoscopy for possible adhesiolysis)  2. Pt provided with laparoscopy procedure pamphlet    By signing my name below, I, Doreatha MartinEva Mathews, attest that this documentation has been prepared under the direction and in the presence of Tilda BurrowJohn V Mattheus Rauls, MD. Electronically Signed: Doreatha MartinEva Mathews, ED Scribe. 08/11/16. 2:12 PM.  I personally performed the services described in this documentation, which was SCRIBED in my presence. The recorded information has been reviewed and considered accurate. It has been edited as necessary during review. Tilda BurrowFERGUSON,Nekita Pita V,  MD

## 2016-09-08 ENCOUNTER — Ambulatory Visit (INDEPENDENT_AMBULATORY_CARE_PROVIDER_SITE_OTHER): Payer: Medicaid Other | Admitting: Obstetrics and Gynecology

## 2016-09-08 ENCOUNTER — Telehealth: Payer: Self-pay | Admitting: Adult Health

## 2016-09-08 ENCOUNTER — Encounter: Payer: Self-pay | Admitting: Obstetrics and Gynecology

## 2016-09-08 DIAGNOSIS — R102 Pelvic and perineal pain: Secondary | ICD-10-CM | POA: Diagnosis not present

## 2016-09-08 DIAGNOSIS — N8189 Other female genital prolapse: Secondary | ICD-10-CM | POA: Diagnosis not present

## 2016-09-08 DIAGNOSIS — R1030 Lower abdominal pain, unspecified: Secondary | ICD-10-CM | POA: Diagnosis not present

## 2016-09-08 DIAGNOSIS — N941 Unspecified dyspareunia: Secondary | ICD-10-CM

## 2016-09-08 NOTE — Progress Notes (Signed)
Patient ID: Norma Vaughn, female   DOB: 1986-07-05, 31 y.o.   MRN: [id number] Preoperative History and Physical  Norma Vaughn is a 31 y.o. [id number] here for surgical management of sharp, intermittent, sudden onset bilateral lower abdominal pain with associated pelvic relaxation, and dyspareunia. No significant preoperative concerns. Pt has had a TVH in 2016 with no immediate issues. Pt denies any other symptoms.   Proposed surgery: diagnostic laparoscopy with possible adhesiolysis.   Past Medical History:  Diagnosis Date  . Anemia   . Contraceptive management 11/07/2013  . Dysmenorrhea 11/25/2014  . Glaucoma   . Irregular intermenstrual bleeding 03/11/2015  . Menorrhagia with regular cycle 11/25/2014  . Migraines   . No pertinent past medical history   . Pelvic pain in female 03/11/2015  . PID (pelvic inflammatory disease) 03/11/2015   Past Surgical History:  Procedure Laterality Date  . CESAREAN SECTION  07/31/2012   Procedure: CESAREAN SECTION;  Surgeon: Willodean Rosenthal, MD;  Location: [facility];  Service: Gynecology;  Laterality: N/A;  primary  . DILATION AND CURETTAGE OF UTERUS    . RECTOCELE REPAIR N/A 07/21/2015   Procedure: POSTERIOR REPAIR (RECTOCELE);  Surgeon: Tilda Burrow, MD;  Location: [facility];  Service: Gynecology;  Laterality: N/A;  . VAGINAL HYSTERECTOMY N/A 07/21/2015   Procedure: HYSTERECTOMY VAGINAL;  Surgeon: Tilda Burrow, MD;  Location: [facility];  Service: Gynecology;  Laterality: N/A;   OB History  Gravida Para Term Preterm AB Living  8 5 3 2 2 5   SAB TAB Ectopic Multiple Live Births  2     1 5     # Outcome Date GA Lbr Len/2nd Weight Sex Delivery Anes PTL Lv  8 SAB 05/2013          7A Preterm 07/31/12 [redacted]w[redacted]d  4 lb 8.7 oz (2.06 kg) F CS-LTranv Spinal  LIV  7B Preterm 07/31/12 [redacted]w[redacted]d   F CS-LTranv Spinal  LIV  6 Term 11/17/10    M Vag-Spont   LIV  5 SAB 02/11/10 [redacted]w[redacted]d            Birth Comments: twins  4 Preterm 06/26/09 [redacted]w[redacted]d   M Vag-Spont   FD  3  Term 11/02/07    F Vag-Spont   LIV  2 Term 12/04/05    F Vag-Spont   LIV  1 Gravida              Birth Comments: System Generated. Please review and update pregnancy details.    Patient denies any other pertinent gynecologic issues.   Current Outpatient Prescriptions on File Prior to Visit  Medication Sig Dispense Refill  . gabapentin (NEURONTIN) 300 MG capsule Take 1 capsule (300 mg total) by mouth 3 (three) times daily. 90 capsule 1  . ibuprofen (ADVIL,MOTRIN) 600 MG tablet Take 1 tablet (600 mg total) by mouth every 6 (six) hours as needed (mild pain). 30 tablet 0  . norgestimate-ethinyl estradiol (ORTHO-CYCLEN,SPRINTEC,PREVIFEM) 0.25-35 MG-MCG tablet Take 1 tablet by mouth daily. 1 Package 11  . polyethylene glycol powder (MIRALAX) powder Take 17 g by mouth daily. To prevent constipation 255 g prn  . oxyCODONE-acetaminophen (PERCOCET/ROXICET) 5-325 MG tablet Take 1-2 tablets by mouth every 4 (four) hours as needed for severe pain (moderate to severe pain (when tolerating fluids)). (Patient not taking: Reported on 09/08/2016) 30 tablet 0   No current facility-administered medications on file prior to visit.    Allergies  Allergen Reactions  . Bee Pollen Hives    Allergic to bee's  Social History:   reports that she quit smoking about 11 years ago. Her smoking use included Cigarettes. She has a 5.00 pack-year smoking history. She has never used smokeless tobacco. She reports that she does not drink alcohol or use drugs.  Family History  Problem Relation Age of Onset  . Hypertension Mother   . Blindness Mother   . Cancer Mother     ovarian  . Diabetes Maternal Aunt   . Diabetes Maternal Uncle   . Hypertension Maternal Uncle   . Stroke Maternal Uncle   . Emphysema Maternal Uncle   . Diabetes Maternal Grandmother   . Cancer Maternal Grandmother     breast, ovarian  . Cancer Maternal Grandfather   . Diabetes Maternal Aunt   . Cancer Maternal Aunt     breast  . Hypertension  Maternal Aunt   . Hypertension Maternal Uncle   . Hypertension Maternal Uncle   . Hypertension Daughter   . Other Daughter     renal blockage  . Other Son     stillborn  . Emphysema Father     Review of Systems: Noncontributory  PHYSICAL EXAM: Last menstrual period 06/18/2015. General appearance - alert, well appearing, and in no distress Chest - clear to auscultation, no wheezes, rales or rhonchi, symmetric air entry Heart - normal rate and regular rhythm Abdomen - soft, nontender, nondistended, no masses or organomegaly Pelvic - normal external genitalia, vulva, vagina, cervix, uterus and adnexa, VULVA: normal appearing vulva with no masses, tenderness or lesions, VAGINA: mildly tender and well supported. Normal appearing vagina with normal color and discharge, no lesions,  CERVIX: normal appearing cervix without discharge or lesions,  UTERUS: uterus is normal size, shape, consistency and nontender,  ADNEXA: normal adnexa in size, nontender and no masses RECTAL: Mildly constipated. Palpation of rectum with stool without pain.  Extremities - peripheral pulses normal, no pedal edema, no clubbing or cyanosis  Labs: No results found for this or any previous visit (from the past 336 hour(s)).  Imaging Studies: No results found.  Assessment: Patient Active Problem List   Diagnosis Date Noted  . Dyspareunia in female 08/11/2016  . Status post vaginal hysterectomy 07/21/2015  . Pelvic pain in female 03/11/2015    Plan: Patient will undergo surgical management with diagnostic laparoscopy with possible adhesiolysis. Alternatives discussed and declined.  No guarantees of improvement of symptoms can be given. Pt aware.  Tilda BurrowJohn V Zoua Caporaso, MD 09/08/2016 11:36 AM   By signing my name below, I, Soijett Blue, attest that this documentation has been prepared under the direction and in the presence of Tilda BurrowJohn V Zakya Halabi, MD. Electronically Signed: Soijett Blue, ED Scribe. 09/08/16. 11:36  AM.  I personally performed the services described in this documentation, which was SCRIBED in my presence. The recorded information has been reviewed and considered accurate. It has been edited as necessary during review. Tilda BurrowFERGUSON,Abdishakur Gottschall V, MD

## 2016-09-12 NOTE — Telephone Encounter (Signed)
Pt have dx surgery 1/30 but she wants ovaries out while in there, told to call Dr Emelda FearFerguson and discuss

## 2016-09-20 NOTE — Patient Instructions (Signed)
Norma BucksDanielle Vaughn  09/20/2016     @PREFPERIOPPHARMACY @   Your procedure is scheduled on  09/27/2016   [facility]  [facility].M.  Call this number if you have problems the morning of surgery:  [phone]   Remember:  Do not eat food or drink liquids after midnight.  Take these medicines the morning of surgery with A SIP OF WATER  Neurontin, oxycodone.   Do not wear jewelry, make-up or nail polish.  Do not wear lotions, powders, or perfumes, or deoderant.  Do not shave 48 hours prior to surgery.  Men may shave face and neck.  Do not bring valuables to the hospital.  [facility] any belongings or valuables.  Contacts, dentures or bridgework may not be worn into surgery.  Leave your suitcase in the car.  After surgery it may be brought to your room.  For patients admitted to the hospital, discharge time will be determined by your treatment team.  Patients discharged the day of surgery will not be allowed to drive home.   Name and phone number of your driver:   family Special instructions:  none  Please read over the following fact sheets that you were given. Anesthesia Post-op Instructions and Care and Recovery After Surgery      Diagnostic Laparoscopy A diagnostic laparoscopy is a procedure to diagnose diseases in the abdomen. During the procedure, a thin, lighted, pencil-sized instrument called a laparoscope is inserted into the abdomen through an incision. The laparoscope allows your health care provider to look at the organs inside your body. Tell a health care provider about:  Any allergies you have.  All medicines you are taking, including vitamins, herbs, eye drops, creams, and over-the-counter medicines.  Any problems you or family members have had with anesthetic medicines.  Any blood disorders you have.  Any surgeries you have had.  Any medical conditions you have. What are the risks? Generally, this is a  safe procedure. However, problems can occur, which may include:  Infection.  Bleeding.  Damage to other organs.  Allergic reaction to the anesthetics used during the procedure. What happens before the procedure?  Do not eat or drink anything after midnight on the night before the procedure or as directed by your health care provider.  Ask your health care provider about:  Changing or stopping your regular medicines.  Taking medicines such as aspirin and ibuprofen. These medicines can thin your blood. Do not take these medicines before your procedure if your health care provider instructs you not to.  Plan to have someone take you home after the procedure. What happens during the procedure?  You may be given a medicine to help you relax (sedative).  You will be given a medicine to make you sleep (general anesthetic).  Your abdomen will be inflated with a gas. This will make your organs easier to see.  Small incisions will be made in your abdomen.  A laparoscope and other small instruments will be inserted into the abdomen through the incisions.  A tissue sample may be removed from an organ in the abdomen for examination.  The instruments will be removed from the abdomen.  The gas will be released.  The incisions will be closed with stitches (sutures). What happens after the procedure? Your blood pressure, heart rate, breathing rate, and blood oxygen level will be monitored often until the medicines you were given have  worn off. This information is not intended to replace advice given to you by your health care provider. Make sure you discuss any questions you have with your health care provider. Document Released: 11/21/2000 Document Revised: 12/24/2015 Document Reviewed: 03/28/2014 Elsevier Interactive Patient Education  2017 [employer]  Diagnostic Laparoscopy, Care After Introduction Refer to this sheet in the next few weeks. These instructions provide you with  information about caring for yourself after your procedure. Your health care provider may also give you more specific instructions. Your treatment has been planned according to current medical practices, but problems sometimes occur. Call your health care provider if you have any problems or questions after your procedure. What can I expect after the procedure? After your procedure, it is common to have mild discomfort in the throat and abdomen. Follow these instructions at home:  Take over-the-counter and prescription medicines only as told by your health care provider.  Do not drive for 24 hours if you received a sedative.  Return to your normal activities as told by your health care provider.  Do not take baths, swim, or use a hot tub until your health care provider approves. You may shower.  Follow instructions from your health care provider about how to take care of your incision. Make sure you:  Wash your hands with soap and water before you change your bandage (dressing). If soap and water are not available, use hand sanitizer.  Change your dressing as told by your health care provider.  Leave stitches (sutures), skin glue, or adhesive strips in place. These skin closures may need to stay in place for 2 weeks or longer. If adhesive strip edges start to loosen and curl up, you may trim the loose edges. Do not remove adhesive strips completely unless your health care provider tells you to do that.  Check your incision area every day for signs of infection. Check for:  More redness, swelling, or pain.  More fluid or blood.  Warmth.  Pus or a bad smell.  It is your responsibility to get the results of your procedure. Ask your health care provider or the department performing the procedure when your results will be ready. Contact a health care provider if:  There is new pain in your shoulders.  You feel light-headed or faint.  You are unable to pass gas or unable to have a  bowel movement.  You feel nauseous or you vomit.  You develop a rash.  You have more redness, swelling, or pain around your incision.  You have more fluid or blood coming from your incision.  Your incision feels warm to the touch.  You have pus or a bad smell coming from your incision.  You have a fever or chills. Get help right away if:  Your pain is getting worse.  You have ongoing vomiting.  The edges of your incision open up.  You have trouble breathing.  You have chest pain. This information is not intended to replace advice given to you by your health care provider. Make sure you discuss any questions you have with your health care provider. Document Released: 07/27/2015 Document Revised: 01/21/2016 Document Reviewed: 04/28/2015  2017 Elsevier  General Anesthesia, Adult General anesthesia is the use of medicines to make a person "go to sleep" (be unconscious) for a medical procedure. General anesthesia is often recommended when a procedure:  Is long.  Requires you to be still or in an unusual position.  Is major and can cause you to  lose blood.  Is impossible to do without general anesthesia. The medicines used for general anesthesia are called general anesthetics. In addition to making you sleep, the medicines:  Prevent pain.  Control your blood pressure.  Relax your muscles. Tell a health care provider about:  Any allergies you have.  All medicines you are taking, including vitamins, herbs, eye drops, creams, and over-the-counter medicines.  Any problems you or family members have had with anesthetic medicines.  Types of anesthetics you have had in the past.  Any bleeding disorders you have.  Any surgeries you have had.  Any medical conditions you have.  Any history of heart or lung conditions, such as heart failure, sleep apnea, or chronic obstructive pulmonary disease (COPD).  Whether you are pregnant or may be pregnant.  Whether you use  tobacco, alcohol, marijuana, or street drugs.  Any history of Financial [employer].  Any history of depression or anxiety. What are the risks? Generally, this is a safe procedure. However, problems may occur, including:  Allergic reaction to anesthetics.  Lung and heart problems.  Inhaling food or liquids from your stomach into your lungs (aspiration).  Injury to nerves.  Waking up during your procedure and being unable to move (rare).  Extreme agitation or a state of mental confusion (delirium) when you wake up from the anesthetic.  Air in the bloodstream, which can lead to stroke. These problems are more likely to develop if you are having a major surgery or if you have an advanced medical condition. You can prevent some of these complications by answering all of your health care provider's questions thoroughly and by following all pre-procedure instructions. General anesthesia can cause side effects, including:  Nausea or vomiting  A sore throat from the breathing tube.  Feeling cold or shivery.  Feeling tired, washed out, or achy.  Sleepiness or drowsiness.  Confusion or agitation. What happens before the procedure? Staying hydrated  Follow instructions from your health care provider about hydration, which may include:  Up to 2 hours before the procedure - you may continue to drink clear liquids, such as water, clear fruit juice, black coffee, and plain tea. Eating and drinking restrictions  Follow instructions from your health care provider about eating and drinking, which may include:  8 hours before the procedure - stop eating heavy meals or foods such as meat, fried foods, or fatty foods.  6 hours before the procedure - stop eating light meals or foods, such as toast or cereal.  6 hours before the procedure - stop drinking milk or drinks that contain milk.  2 hours before the procedure - stop drinking clear liquids. Medicines  Ask your health care provider  about:  Changing or stopping your regular medicines. This is especially important if you are taking diabetes medicines or blood thinners.  Taking medicines such as aspirin and ibuprofen. These medicines can thin your blood. Do not take these medicines before your procedure if your health care provider instructs you not to.  Taking new dietary supplements or medicines. Do not take these during the week before your procedure unless your health care provider approves them.  If you are told to take a medicine or to continue taking a medicine on the day of the procedure, take the medicine with sips of water. General instructions   Ask if you will be going home the same day, the following day, or after a longer hospital stay.  Plan to have someone take you home.  Plan to  have someone stay with you for the first 24 hours after you leave the hospital or clinic.  For 3-6 weeks before the procedure, try not to use any tobacco products, such as cigarettes, chewing tobacco, and e-cigarettes.  You may brush your teeth on the morning of the procedure, but make sure to spit out the toothpaste. What happens during the procedure?  You will be given anesthetics through a mask and through an IV tube in one of your veins.  You may receive medicine to help you relax (sedative).  As soon as you are asleep, a breathing tube may be used to help you breathe.  An anesthesia specialist will stay with you throughout the procedure. He or she will help keep you comfortable and safe by continuing to give you medicines and adjusting the amount of medicine that you get. He or she will also watch your blood pressure, pulse, and oxygen levels to make sure that the anesthetics do not cause any problems.  If a breathing tube was used to help you breathe, it will be removed before you wake up. The procedure may vary among health care providers and hospitals. What happens after the procedure?  You will wake up, often  slowly, after the procedure is complete, usually in a recovery area.  Your blood pressure, heart rate, breathing rate, and blood oxygen level will be monitored until the medicines you were given have worn off.  You may be given medicine to help you calm down if you feel anxious or agitated.  If you will be going home the same day, your health care provider may check to make sure you can stand, drink, and urinate.  Your health care providers will treat your pain and side effects before you go home.  Do not drive for 24 hours if you received a sedative.  You may:  Feel nauseous and vomit.  Have a sore throat.  Have mental slowness.  Feel cold or shivery.  Feel sleepy.  Feel tired.  Feel sore or achy, even in parts of your body where you did not have surgery. This information is not intended to replace advice given to you by your health care provider. Make sure you discuss any questions you have with your health care provider. Document Released: 11/22/2007 Document Revised: 01/26/2016 Document Reviewed: 07/30/2015 Elsevier Interactive Patient Education  2017 Elsevier Inc. General Anesthesia, Adult, Care After These instructions provide you with information about caring for yourself after your procedure. Your health care provider may also give you more specific instructions. Your treatment has been planned according to current medical practices, but problems sometimes occur. Call your health care provider if you have any problems or questions after your procedure. What can I expect after the procedure? After the procedure, it is common to have:  Vomiting.  A sore throat.  Mental slowness. It is common to feel:  Nauseous.  Cold or shivery.  Sleepy.  Tired.  Sore or achy, even in parts of your body where you did not have surgery. Follow these instructions at home: For at least 24 hours after the procedure:  Do not:  Participate in activities where you could fall or  become injured.  Drive.  Use heavy machinery.  Drink alcohol.  Take sleeping pills or medicines that cause drowsiness.  Make important decisions or sign legal documents.  Take care of children on your own.  Rest. Eating and drinking  If you vomit, drink water, juice, or soup when you can drink without  vomiting.  Drink enough fluid to keep your urine clear or pale yellow.  Make sure you have little or no nausea before eating solid foods.  Follow the diet recommended by your health care provider. General instructions  Have a responsible adult stay with you until you are awake and alert.  Return to your normal activities as told by your health care provider. Ask your health care provider what activities are safe for you.  Take over-the-counter and prescription medicines only as told by your health care provider.  If you smoke, do not smoke without supervision.  Keep all follow-up visits as told by your health care provider. This is important. Contact a health care provider if:  You continue to have nausea or vomiting at home, and medicines are not helpful.  You cannot drink fluids or start eating again.  You cannot urinate after 8-12 hours.  You develop a skin rash.  You have fever.  You have increasing redness at the site of your procedure. Get help right away if:  You have difficulty breathing.  You have chest pain.  You have unexpected bleeding.  You feel that you are having a life-threatening or urgent problem. This information is not intended to replace advice given to you by your health care provider. Make sure you discuss any questions you have with your health care provider. Document Released: 11/21/2000 Document Revised: 01/18/2016 Document Reviewed: 07/30/2015 Elsevier Interactive Patient Education  2017 [employer].

## 2016-09-22 ENCOUNTER — Telehealth: Payer: Self-pay | Admitting: Obstetrics and Gynecology

## 2016-09-22 ENCOUNTER — Other Ambulatory Visit (HOSPITAL_COMMUNITY): Payer: Medicaid Other

## 2016-09-22 ENCOUNTER — Other Ambulatory Visit: Payer: Self-pay | Admitting: Obstetrics and Gynecology

## 2016-09-22 NOTE — Telephone Encounter (Signed)
Pt called , and she desires that the Right Ovary be removed at time of laparoscopy , as there has been chronic pain on that side x yrs. And she will not trust that it will not recur. Pt is aware that there are NO absolute guarantees that pain will be resolved by removal of ovary . Pt does agree to preservation of the Left ovary , as she has rarely had Left LQ Pain and acknowledges the need for hormonal Therapy if both ovaries removed. Pt does desire for bilateral ovarian removal if pathology that would likely eventually warrant ovary removal noted.

## 2016-09-23 ENCOUNTER — Encounter (HOSPITAL_COMMUNITY)
Admission: RE | Admit: 2016-09-23 | Discharge: 2016-09-23 | Disposition: A | Discharge status: Home / Self Care | Payer: Medicaid Other | Source: Ambulatory Visit | Attending: Obstetrics and Gynecology | Admitting: Obstetrics and Gynecology

## 2016-09-23 ENCOUNTER — Encounter (HOSPITAL_COMMUNITY): Payer: Self-pay

## 2016-09-23 DIAGNOSIS — Z01818 Encounter for other preprocedural examination: Secondary | ICD-10-CM | POA: Insufficient documentation

## 2016-09-23 DIAGNOSIS — R102 Pelvic and perineal pain: Secondary | ICD-10-CM | POA: Insufficient documentation

## 2016-09-23 DIAGNOSIS — N941 Unspecified dyspareunia: Secondary | ICD-10-CM | POA: Diagnosis not present

## 2016-09-23 LAB — COMPREHENSIVE METABOLIC PANEL
ALT: 25 U/L (ref 14–54)
AST: 25 U/L (ref 15–41)
Albumin: 4 g/dL (ref 3.5–5.0)
Alkaline Phosphatase: 60 U/L (ref 38–126)
Anion gap: 7 (ref 5–15)
BILIRUBIN TOTAL: 0.8 mg/dL (ref 0.3–1.2)
BUN: 10 mg/dL (ref 6–20)
CHLORIDE: 104 mmol/L (ref 101–111)
CO2: 27 mmol/L (ref 22–32)
CREATININE: 0.8 mg/dL (ref 0.44–1.00)
Calcium: 9 mg/dL (ref 8.9–10.3)
Glucose, Bld: 102 mg/dL — ABNORMAL HIGH (ref 65–99)
Potassium: 3.7 mmol/L (ref 3.5–5.1)
Sodium: 138 mmol/L (ref 135–145)
TOTAL PROTEIN: 7 g/dL (ref 6.5–8.1)

## 2016-09-23 LAB — CBC
HEMATOCRIT: 44.4 % (ref 36.0–46.0)
Hemoglobin: 15.1 g/dL — ABNORMAL HIGH (ref 12.0–15.0)
MCH: 31.1 pg (ref 26.0–34.0)
MCHC: 34 g/dL (ref 30.0–36.0)
MCV: 91.5 fL (ref 78.0–100.0)
PLATELETS: 191 10*3/uL (ref 150–400)
RBC: 4.85 MIL/uL (ref 3.87–5.11)
RDW: 12.5 % (ref 11.5–15.5)
WBC: 6.9 10*3/uL (ref 4.0–10.5)

## 2016-09-23 LAB — URINALYSIS, ROUTINE W REFLEX MICROSCOPIC
BILIRUBIN URINE: NEGATIVE
Glucose, UA: NEGATIVE mg/dL
HGB URINE DIPSTICK: NEGATIVE
KETONES UR: NEGATIVE mg/dL
Leukocytes, UA: NEGATIVE
Nitrite: NEGATIVE
PH: 6 (ref 5.0–8.0)
Protein, ur: NEGATIVE mg/dL
SPECIFIC GRAVITY, URINE: 1.025 (ref 1.005–1.030)

## 2016-09-23 LAB — TYPE AND SCREEN
ABO/RH(D): O POS
ANTIBODY SCREEN: NEGATIVE

## 2016-09-27 ENCOUNTER — Ambulatory Visit (HOSPITAL_COMMUNITY)
Admission: RE | Admit: 2016-09-27 | Discharge: 2016-09-27 | Disposition: A | Discharge status: Home / Self Care | Payer: Medicaid Other | Source: Ambulatory Visit | Attending: Obstetrics and Gynecology | Admitting: Obstetrics and Gynecology

## 2016-09-27 ENCOUNTER — Ambulatory Visit (HOSPITAL_COMMUNITY): Payer: Medicaid Other | Admitting: Anesthesiology

## 2016-09-27 ENCOUNTER — Encounter (HOSPITAL_COMMUNITY): Payer: Self-pay | Admitting: *Deleted

## 2016-09-27 ENCOUNTER — Encounter (HOSPITAL_COMMUNITY)
Admission: RE | Disposition: A | Discharge status: Home / Self Care | Payer: Self-pay | Source: Ambulatory Visit | Attending: Obstetrics and Gynecology

## 2016-09-27 DIAGNOSIS — R102 Pelvic and perineal pain unspecified side: Secondary | ICD-10-CM | POA: Diagnosis present

## 2016-09-27 DIAGNOSIS — Z87891 Personal history of nicotine dependence: Secondary | ICD-10-CM | POA: Diagnosis not present

## 2016-09-27 DIAGNOSIS — N8301 Follicular cyst of right ovary: Secondary | ICD-10-CM | POA: Insufficient documentation

## 2016-09-27 DIAGNOSIS — N941 Unspecified dyspareunia: Secondary | ICD-10-CM | POA: Diagnosis present

## 2016-09-27 DIAGNOSIS — N838 Other noninflammatory disorders of ovary, fallopian tube and broad ligament: Secondary | ICD-10-CM

## 2016-09-27 DIAGNOSIS — N8311 Corpus luteum cyst of right ovary: Secondary | ICD-10-CM | POA: Diagnosis not present

## 2016-09-27 DIAGNOSIS — R1030 Lower abdominal pain, unspecified: Secondary | ICD-10-CM | POA: Insufficient documentation

## 2016-09-27 DIAGNOSIS — K660 Peritoneal adhesions (postprocedural) (postinfection): Secondary | ICD-10-CM | POA: Diagnosis not present

## 2016-09-27 DIAGNOSIS — N736 Female pelvic peritoneal adhesions (postinfective): Secondary | ICD-10-CM | POA: Diagnosis not present

## 2016-09-27 HISTORY — PX: LYSIS OF ADHESION: SHX5961

## 2016-09-27 HISTORY — PX: LAPAROSCOPY: SHX197

## 2016-09-27 HISTORY — PX: LAPAROSCOPIC SALPINGO OOPHERECTOMY: SHX5927

## 2016-09-27 SURGERY — LAPAROSCOPY, DIAGNOSTIC
Anesthesia: General | Site: Abdomen | Laterality: Right

## 2016-09-27 MED ORDER — ONDANSETRON HCL 4 MG/2ML IJ SOLN
4.0000 mg | Freq: Once | INTRAMUSCULAR | Status: AC
  Administered 2016-09-27: 4 mg via INTRAVENOUS

## 2016-09-27 MED ORDER — MIDAZOLAM HCL 2 MG/2ML IJ SOLN
INTRAMUSCULAR | Status: AC
  Filled 2016-09-27: qty 2

## 2016-09-27 MED ORDER — LACTATED RINGERS IV SOLN
INTRAVENOUS | Status: DC
  Administered 2016-09-27: 09:00:00 via INTRAVENOUS
  Administered 2016-09-27: 1000 mL via INTRAVENOUS

## 2016-09-27 MED ORDER — FENTANYL CITRATE (PF) 250 MCG/5ML IJ SOLN
INTRAMUSCULAR | Status: AC
  Filled 2016-09-27: qty 5

## 2016-09-27 MED ORDER — KETOROLAC TROMETHAMINE 30 MG/ML IJ SOLN
INTRAMUSCULAR | Status: AC
  Filled 2016-09-27: qty 1

## 2016-09-27 MED ORDER — LIDOCAINE HCL (PF) 1 % IJ SOLN
INTRAMUSCULAR | Status: AC
  Filled 2016-09-27: qty 5

## 2016-09-27 MED ORDER — GLYCOPYRROLATE 0.2 MG/ML IJ SOLN
INTRAMUSCULAR | Status: AC
Start: 2016-09-27 — End: 2016-09-27
  Filled 2016-09-27: qty 3

## 2016-09-27 MED ORDER — MIDAZOLAM HCL 2 MG/2ML IJ SOLN
0.5000 mg | INTRAMUSCULAR | Status: DC | PRN
  Administered 2016-09-27: 2 mg via INTRAVENOUS

## 2016-09-27 MED ORDER — CEFAZOLIN SODIUM-DEXTROSE 2-4 GM/100ML-% IV SOLN
INTRAVENOUS | Status: AC
  Filled 2016-09-27: qty 100

## 2016-09-27 MED ORDER — SUCCINYLCHOLINE CHLORIDE 20 MG/ML IJ SOLN
INTRAMUSCULAR | Status: AC
  Filled 2016-09-27: qty 1

## 2016-09-27 MED ORDER — NEOSTIGMINE METHYLSULFATE 10 MG/10ML IV SOLN
INTRAVENOUS | Status: AC
Start: 2016-09-27 — End: 2016-09-27
  Filled 2016-09-27: qty 1

## 2016-09-27 MED ORDER — BUPIVACAINE HCL (PF) 0.5 % IJ SOLN
INTRAMUSCULAR | Status: AC
  Filled 2016-09-27: qty 30

## 2016-09-27 MED ORDER — ONDANSETRON HCL 4 MG/2ML IJ SOLN
INTRAMUSCULAR | Status: AC
  Filled 2016-09-27: qty 2

## 2016-09-27 MED ORDER — HYDROMORPHONE HCL 1 MG/ML IJ SOLN: 0.2500 mg | INTRAMUSCULAR | Status: DC | PRN

## 2016-09-27 MED ORDER — FENTANYL CITRATE (PF) 100 MCG/2ML IJ SOLN
INTRAMUSCULAR | Status: DC | PRN
  Administered 2016-09-27 (×3): 50 ug via INTRAVENOUS

## 2016-09-27 MED ORDER — ROCURONIUM 10MG/ML (10ML) SYRINGE FOR MEDFUSION PUMP - OPTIME
INTRAVENOUS | Status: DC | PRN
  Administered 2016-09-27: 30 mg via INTRAVENOUS

## 2016-09-27 MED ORDER — ROCURONIUM BROMIDE 50 MG/5ML IV SOLN
INTRAVENOUS | Status: AC
  Filled 2016-09-27: qty 1

## 2016-09-27 MED ORDER — OXYCODONE-ACETAMINOPHEN 5-325 MG PO TABS: 1.0000 | ORAL_TABLET | ORAL | 0 refills | Status: AC | PRN

## 2016-09-27 MED ORDER — NEOSTIGMINE METHYLSULFATE 10 MG/10ML IV SOLN
INTRAVENOUS | Status: DC | PRN
  Administered 2016-09-27: 3 mg via INTRAVENOUS

## 2016-09-27 MED ORDER — PROMETHAZINE HCL 25 MG PO TABS: 25.0000 mg | ORAL_TABLET | Freq: Four times a day (QID) | ORAL | 0 refills | Status: AC | PRN

## 2016-09-27 MED ORDER — GLYCOPYRROLATE 0.2 MG/ML IJ SOLN
INTRAMUSCULAR | Status: DC | PRN
Start: 2016-09-27 — End: 2016-09-27
  Administered 2016-09-27: .6 mg via INTRAVENOUS

## 2016-09-27 MED ORDER — PROPOFOL 10 MG/ML IV BOLUS
INTRAVENOUS | Status: AC
  Filled 2016-09-27: qty 20

## 2016-09-27 MED ORDER — CEFAZOLIN SODIUM-DEXTROSE 2-4 GM/100ML-% IV SOLN
2.0000 g | INTRAVENOUS | Status: AC
  Administered 2016-09-27: 2 g via INTRAVENOUS

## 2016-09-27 MED ORDER — PROPOFOL 10 MG/ML IV BOLUS
INTRAVENOUS | Status: DC | PRN
  Administered 2016-09-27: 120 mg via INTRAVENOUS

## 2016-09-27 MED ORDER — BUPIVACAINE HCL (PF) 0.5 % IJ SOLN
INTRAMUSCULAR | Status: DC | PRN
  Administered 2016-09-27: 16 mL

## 2016-09-27 MED ORDER — 0.9 % SODIUM CHLORIDE (POUR BTL) OPTIME
TOPICAL | Status: DC | PRN
  Administered 2016-09-27: 1000 mL

## 2016-09-27 MED ORDER — KETOROLAC TROMETHAMINE 30 MG/ML IJ SOLN
30.0000 mg | Freq: Once | INTRAMUSCULAR | Status: AC
  Administered 2016-09-27: 30 mg via INTRAVENOUS

## 2016-09-27 MED ORDER — LIDOCAINE HCL (CARDIAC) 10 MG/ML IV SOLN
INTRAVENOUS | Status: DC | PRN
  Administered 2016-09-27: 40 mg via INTRAVENOUS

## 2016-09-27 SURGICAL SUPPLY — 49 items
BAG HAMPER (MISCELLANEOUS) ×4 IMPLANT
BANDAGE STRIP 1X3 FLEXIBLE (GAUZE/BANDAGES/DRESSINGS) ×4 IMPLANT
BENZOIN TINCTURE PRP APPL 2/3 (GAUZE/BANDAGES/DRESSINGS) ×4 IMPLANT
BLADE SURG SZ11 CARB STEEL (BLADE) ×4 IMPLANT
CLOSURE WOUND 1/2 X4 (GAUZE/BANDAGES/DRESSINGS) ×1
CLOSURE WOUND 1/4 X3 (GAUZE/BANDAGES/DRESSINGS) ×1
CLOTH BEACON ORANGE TIMEOUT ST (SAFETY) ×4 IMPLANT
COVER LIGHT HANDLE STERIS (MISCELLANEOUS) ×8 IMPLANT
DURAPREP 26ML APPLICATOR (WOUND CARE) ×4 IMPLANT
ELECT REM PT RETURN 9FT ADLT (ELECTROSURGICAL) ×4
ELECTRODE REM PT RTRN 9FT ADLT (ELECTROSURGICAL) ×2 IMPLANT
GLOVE BIOGEL M 7.0 STRL (GLOVE) ×4 IMPLANT
GLOVE BIOGEL PI IND STRL 7.0 (GLOVE) ×6 IMPLANT
GLOVE BIOGEL PI IND STRL 9 (GLOVE) ×2 IMPLANT
GLOVE BIOGEL PI INDICATOR 7.0 (GLOVE) ×6
GLOVE BIOGEL PI INDICATOR 9 (GLOVE) ×2
GLOVE ECLIPSE 9.0 STRL (GLOVE) ×4 IMPLANT
GLOVE EXAM NITRILE MD LF STRL (GLOVE) ×4 IMPLANT
GOWN SPEC L3 XXLG W/TWL (GOWN DISPOSABLE) ×4 IMPLANT
GOWN STRL REUS W/TWL LRG LVL3 (GOWN DISPOSABLE) ×4 IMPLANT
INST SET LAPROSCOPIC GYN AP (KITS) ×8 IMPLANT
IV STOPCOCK 4 WAY 40  W/Y SET (IV SOLUTION)
IV STOPCOCK 4 WAY 40 W/Y SET (IV SOLUTION) IMPLANT
KIT ROOM TURNOVER AP CYSTO (KITS) ×4 IMPLANT
MANIFOLD NEPTUNE II (INSTRUMENTS) ×4 IMPLANT
NEEDLE HYPO 25X1 1.5 SAFETY (NEEDLE) ×4 IMPLANT
NEEDLE INSUFFLATION 14GA 120MM (NEEDLE) ×4 IMPLANT
PACK PERI GYN (CUSTOM PROCEDURE TRAY) ×4 IMPLANT
PAD ARMBOARD 7.5X6 YLW CONV (MISCELLANEOUS) ×4 IMPLANT
POUCH SPECIMEN RETRIEVAL 10MM (ENDOMECHANICALS) ×4 IMPLANT
SET BASIN LINEN APH (SET/KITS/TRAYS/PACK) ×4 IMPLANT
SET TUBE IRRIG SUCTION NO TIP (IRRIGATION / IRRIGATOR) IMPLANT
SHEARS HARMONIC ACE PLUS 36CM (ENDOMECHANICALS) ×4 IMPLANT
SLEEVE ENDOPATH XCEL 5M (ENDOMECHANICALS) ×4 IMPLANT
SOLUTION ANTI FOG 6CC (MISCELLANEOUS) ×4 IMPLANT
STRIP CLOSURE SKIN 1/2X4 (GAUZE/BANDAGES/DRESSINGS) ×3 IMPLANT
STRIP CLOSURE SKIN 1/4X3 (GAUZE/BANDAGES/DRESSINGS) ×3 IMPLANT
SUT VIC AB 4-0 PS2 27 (SUTURE) ×4 IMPLANT
SUT VICRYL 0 UR6 27IN ABS (SUTURE) ×4 IMPLANT
SUT VICRYL AB 3-0 FS1 BRD 27IN (SUTURE) IMPLANT
SYR BULB IRRIGATION 50ML (SYRINGE) ×4 IMPLANT
SYR CONTROL 10ML LL (SYRINGE) ×4 IMPLANT
SYRINGE 10CC LL (SYRINGE) ×4 IMPLANT
TRAY FOLEY CATH SILVER 16FR (SET/KITS/TRAYS/PACK) ×4 IMPLANT
TROCAR ENDO BLADELESS 11MM (ENDOMECHANICALS) ×4 IMPLANT
TROCAR XCEL NON-BLD 5MMX100MML (ENDOMECHANICALS) ×4 IMPLANT
TROCAR XCEL UNIV SLVE 11M 100M (ENDOMECHANICALS) ×4 IMPLANT
TUBING INSUFFLATION (TUBING) ×4 IMPLANT
WARMER LAPAROSCOPE (MISCELLANEOUS) ×4 IMPLANT

## 2016-09-27 NOTE — Transfer of Care (Signed)
Immediate Anesthesia Transfer of Care Note  Patient: Norma Vaughn  Procedure(s) Performed: Procedure(s): LAPAROSCOPY DIAGNOSTIC (N/A) LAPAROSCOPIC SALPINGO OOPHORECTOMY (Right) LYSIS OF ADHESION (N/A)  Patient Location: PACU  Anesthesia Type:General  Level of Consciousness: awake  Airway & Oxygen Therapy: Patient Spontanous Breathing and Patient connected to face mask oxygen  Post-op Assessment: Report given to RN  Post vital signs: Reviewed and stable  Last Vitals:  Vitals:   09/27/16 0715 09/27/16 0730  BP: 111/80   Resp: 14 (!) 27  Temp:      Last Pain:  Vitals:   09/27/16 0644  TempSrc: Oral      Patients Stated Pain Goal: 9 (09/27/16 0644)  Complications: No apparent anesthesia complications

## 2016-09-27 NOTE — Anesthesia Postprocedure Evaluation (Signed)
Anesthesia Post Note  Patient: Norma Vaughn  Procedure(s) Performed: Procedure(s) (LRB): LAPAROSCOPY DIAGNOSTIC (N/A) LAPAROSCOPIC SALPINGO OOPHORECTOMY (Right) LYSIS OF ADHESION (N/A)  Patient location during evaluation: Short Stay Anesthesia Type: General Level of consciousness: awake and alert and oriented Pain management: pain level controlled Vital Signs Assessment: post-procedure vital signs reviewed and stable Respiratory status: spontaneous breathing Cardiovascular status: blood pressure returned to baseline Postop Assessment: no signs of nausea or vomiting Anesthetic complications: no     Last Vitals:  Vitals:   09/27/16 0945 09/27/16 1001  BP: 118/82 114/74  Pulse: 65 63  Resp: 14 14  Temp:  36.4 C    Last Pain:  Vitals:   09/27/16 1001  TempSrc: Oral  PainSc: 2                  Jorie Zee

## 2016-09-27 NOTE — Discharge Instructions (Signed)
Diagnostic Laparoscopy A diagnostic laparoscopy is a procedure to diagnose diseases in the abdomen. During the procedure, a thin, lighted, pencil-sized instrument called a laparoscope is inserted into the abdomen through an incision. The laparoscope allows your health care provider to look at the organs inside your body.  Unilateral Salpingo-Oophorectomy, Care After Refer to this sheet in the next few weeks. These instructions provide you with information on caring for yourself after your procedure. Your health care provider may also give you more specific instructions. Your treatment has been planned according to current medical practices, but problems sometimes occur. Call your health care provider if you have any problems or questions after your procedure. WHAT TO EXPECT AFTER THE PROCEDURE After your procedure, it is typical to have the following:  Abdominal pain that can be controlled with pain medicine.  Vaginal spotting.  Constipation. HOME CARE INSTRUCTIONS   Get plenty of rest and sleep.  Only take over-the-counter or prescription medicines as directed by your health care provider. Do not take aspirin. It can cause bleeding.  Keep incision areas clean and dry. Remove or change any bandages (dressings) only as directed by your health care provider.  Follow your health care provider's advice regarding diet.  Drink enough fluids to keep your urine clear or pale yellow.  Limit exercise and activities as directed by your health care provider. Do not lift anything heavier than 5 pounds (2.3 kg) until your health care provider approves.  Do not drive until your health care provider approves.  Do not drink alcohol until your health care provider approves.  Do not have sexual intercourse until your health care provider says it is OK.  Take your temperature twice a day and write it down.  If you become constipated, you may:  Ask your health care provider about taking a mild  laxative.  Add more fruit and bran to your diet.  Drink more fluids.  Follow up with your health care provider as directed. SEEK MEDICAL CARE IF:   You have swelling or redness in the incision area.  You develop a rash.  You feel lightheaded.  You have pain that is not controlled with medicine.  You have pain, swelling, or redness where the IV access tube was placed. SEEK IMMEDIATE MEDICAL CARE IF:  You have a fever.  You develop increasing abdominal pain.  You see pus coming out of the incision, or the incision is separating.  You notice a bad smell coming from the wound or dressing.  You have excessive vaginal bleeding.  You feel sick to your stomach (nauseous) and vomit.  You have leg or chest pain.  You have pain when you urinate.  You develop shortness of breath.  You pass out. This information is not intended to replace advice given to you by your health care provider. Make sure you discuss any questions you have with your health care provider. Document Released: 06/11/2009 Document Revised: 06/05/2013 Document Reviewed: 02/06/2013 Elsevier Interactive Patient Education  2017 [employer]

## 2016-09-27 NOTE — Op Note (Signed)
Please see the brief operative note for surgical details of laparoscopic right salpingo-oophorectomy and lysis of adhesions

## 2016-09-27 NOTE — H&P (Signed)
Tilda Burrow, MD  Gynecology  Expand All Collapse All   [] Hide copied text [] Hover for attribution information Patient ID: Norma Vaughn, female   DOB: 1986/07/22, 31 y.o.   MRN: [id number] Preoperative History and Physical  Norma Vaughn is a 31 y.o. [id number] here for surgical management of sharp, intermittent, sudden onset lower abdominal pain with associated pelvic relaxation, and dyspareunia. No significant preoperative concerns. Pt has had a TVH in 2016 with no immediate issues. Pt denies any other symptoms.  Surgical options have been discussed, , and she desires that the Right Ovary be removed at time of laparoscopy , as there has been chronic pain on that side x 2 yrs. And she will not trust that it will not recur. Pt is aware that there are NO absolute guarantees that pain will be resolved by removal of ovary . Pt does agree to preservation of the Left ovary , as she has rarely had Left LQ Pain and acknowledges the need for hormonal Therapy if both ovaries removed. Pt does desire for bilateral ovarian removal if pathology that would likely eventually warrant ovary removal noted.  Proposed surgery: diagnostic laparoscopy with Right salpingo oophorectomy, and lysis of adhesions..       Past Medical History:  Diagnosis Date  . Anemia   . Contraceptive management 11/07/2013  . Dysmenorrhea 11/25/2014  . Glaucoma   . Irregular intermenstrual bleeding 03/11/2015  . Menorrhagia with regular cycle 11/25/2014  . Migraines   . No pertinent past medical history   . Pelvic pain in female 03/11/2015  . PID (pelvic inflammatory disease) 03/11/2015        Past Surgical History:  Procedure Laterality Date  . CESAREAN SECTION  07/31/2012   Procedure: CESAREAN SECTION;  Surgeon: Willodean Rosenthal, MD;  Location: [facility];  Service: Gynecology;  Laterality: N/A;  primary  . DILATION AND CURETTAGE OF UTERUS    . RECTOCELE REPAIR N/A 07/21/2015   Procedure: POSTERIOR REPAIR  (RECTOCELE);  Surgeon: Tilda Burrow, MD;  Location: [facility];  Service: Gynecology;  Laterality: N/A;  . VAGINAL HYSTERECTOMY N/A 07/21/2015   Procedure: HYSTERECTOMY VAGINAL;  Surgeon: Tilda Burrow, MD;  Location: [facility];  Service: Gynecology;  Laterality: N/A;                   OB History  Gravida Para Term Preterm AB Living  8 5 3 2 2 5   SAB TAB Ectopic Multiple Live Births  2     1 5     # Outcome Date GA Lbr Len/2nd Weight Sex Delivery Anes PTL Lv  8 SAB 05/2013          7A Preterm 07/31/12 [redacted]w[redacted]d  4 lb 8.7 oz (2.06 kg) F CS-LTranv Spinal  LIV  7B Preterm 07/31/12 [redacted]w[redacted]d   F CS-LTranv Spinal  LIV  6 Term 11/17/10    M Vag-Spont   LIV  5 SAB 02/11/10 [redacted]w[redacted]d            Birth Comments: twins  4 Preterm 06/26/09 [redacted]w[redacted]d   M Vag-Spont   FD  3 Term 11/02/07    F Vag-Spont   LIV  2 Term 12/04/05    F Vag-Spont   LIV  1 Gravida              Birth Comments: System Generated. Please review and update pregnancy details.    Patient denies any other pertinent gynecologic issues.         Current Outpatient Prescriptions  on File Prior to Visit  Medication Sig Dispense Refill  . gabapentin (NEURONTIN) 300 MG capsule Take 1 capsule (300 mg total) by mouth 3 (three) times daily. 90 capsule 1  . ibuprofen (ADVIL,MOTRIN) 600 MG tablet Take 1 tablet (600 mg total) by mouth every 6 (six) hours as needed (mild pain). 30 tablet 0  . norgestimate-ethinyl estradiol (ORTHO-CYCLEN,SPRINTEC,PREVIFEM) 0.25-35 MG-MCG tablet Take 1 tablet by mouth daily. 1 Package 11  . polyethylene glycol powder (MIRALAX) powder Take 17 g by mouth daily. To prevent constipation 255 g prn  . oxyCODONE-acetaminophen (PERCOCET/ROXICET) 5-325 MG tablet Take 1-2 tablets by mouth every 4 (four) hours as needed for severe pain (moderate to severe pain (when tolerating fluids)). (Patient not taking: Reported on 09/08/2016) 30 tablet 0   No current facility-administered medications  on file prior to visit.         Allergies  Allergen Reactions  . Bee Pollen Hives    Allergic to bee's    Social History:   reports that she quit smoking about 11 years ago. Her smoking use included Cigarettes. She has a 5.00 pack-year smoking history. She has never used smokeless tobacco. She reports that she does not drink alcohol or use drugs.        Family History  Problem Relation Age of Onset  . Hypertension Mother   . Blindness Mother   . Cancer Mother     ovarian  . Diabetes Maternal Aunt   . Diabetes Maternal Uncle   . Hypertension Maternal Uncle   . Stroke Maternal Uncle   . Emphysema Maternal Uncle   . Diabetes Maternal Grandmother   . Cancer Maternal Grandmother     breast, ovarian  . Cancer Maternal Grandfather   . Diabetes Maternal Aunt   . Cancer Maternal Aunt     breast  . Hypertension Maternal Aunt   . Hypertension Maternal Uncle   . Hypertension Maternal Uncle   . Hypertension Daughter   . Other Daughter     renal blockage  . Other Son     stillborn  . Emphysema Father     Review of Systems: Noncontributory  PHYSICAL EXAM: Last menstrual period 06/18/2015. General appearance - alert, well appearing, and in no distress Chest - clear to auscultation, no wheezes, rales or rhonchi, symmetric air entry Heart - normal rate and regular rhythm Abdomen - soft, nontender, nondistended, no masses or organomegaly Pelvic - normal external genitalia, vulva, vagina, cervix, uterus and adnexa, VULVA: normal appearing vulva with no masses, tenderness or lesions, VAGINA: mildly tender and well supported. Normal appearing vagina with normal color and discharge, no lesions,  CERVIX: absent UTERUS: surgically absent ADNEXA: normal adnexa in size, slight tenderness of cuff to palpation. RECTAL: Mildly constipated. Palpation of rectum with stool without pain.  Extremities - peripheral pulses normal, no pedal edema, no clubbing or  cyanosis  Labs:  CBC    Component Value Date/Time   WBC 6.9 09/23/2016 1123   RBC 4.85 09/23/2016 1123   HGB 15.1 (H) 09/23/2016 1123   HGB 14.6 01/18/2012 1700   HCT 44.4 09/23/2016 1123   HCT 43.2 03/11/2015 1644   PLT 191 09/23/2016 1123   PLT 168 03/11/2015 1644   MCV 91.5 09/23/2016 1123   MCV 90 03/11/2015 1644   MCH 31.1 09/23/2016 1123   MCHC 34.0 09/23/2016 1123   RDW 12.5 09/23/2016 1123   RDW 12.9 03/11/2015 1644   BMET    Component Value Date/Time   NA 138  09/23/2016 1123   NA 140 03/11/2015 1644   K 3.7 09/23/2016 1123   CL 104 09/23/2016 1123   CO2 27 09/23/2016 1123   GLUCOSE 102 (H) 09/23/2016 1123   BUN 10 09/23/2016 1123   BUN 8 03/11/2015 1644   CREATININE 0.80 09/23/2016 1123   CALCIUM 9.0 09/23/2016 1123   GFRNONAA >60 09/23/2016 1123   GFRAA >60 09/23/2016 1123    Imaging Studies: ImagingResults  No results found.    Assessment:     Patient Active Problem List   Diagnosis Date Noted  . Dyspareunia in female 08/11/2016  . Status post vaginal hysterectomy 07/21/2015  . Pelvic pain in female 03/11/2015    Plan: Patient will undergo surgical management with diagnostic laparoscopy and right salpingoophorectomy, possible lysis of adhesions.

## 2016-09-27 NOTE — Anesthesia Procedure Notes (Signed)
Procedure Name: Intubation Date/Time: 09/27/2016 7:43 AM Performed by: Tressie Stalker E Pre-anesthesia Checklist: Patient identified, Patient being monitored, Timeout performed, Emergency Drugs available and Suction available Patient Re-evaluated:Patient Re-evaluated prior to inductionOxygen Delivery Method: Circle system utilized Preoxygenation: Pre-oxygenation with 100% oxygen Intubation Type: IV induction Ventilation: Mask ventilation without difficulty Laryngoscope Size: Mac and 3 Grade View: Grade I Tube type: Oral Tube size: 7.0 mm Number of attempts: 1 Airway Equipment and Method: Stylet Placement Confirmation: ETT inserted through vocal cords under direct vision,  positive ETCO2 and breath sounds checked- equal and bilateral Secured at: 21 cm Tube secured with: Tape Dental Injury: Teeth and Oropharynx as per pre-operative assessment

## 2016-09-27 NOTE — Anesthesia Preprocedure Evaluation (Signed)
Anesthesia Evaluation  Patient identified by MRN, date of birth, ID band Patient awake    Reviewed: Allergy & Precautions, H&P , NPO status , Patient's Chart, lab work & pertinent test results, reviewed documented beta blocker date and time   History of Anesthesia Complications Negative for: history of anesthetic complications  Airway Mallampati: I  TM Distance: >3 FB Neck ROM: full    Dental  (+) Teeth Intact   Pulmonary former smoker,    breath sounds clear to auscultation       Cardiovascular negative cardio ROS   Rhythm:regular Rate:Normal     Neuro/Psych  Headaches, negative neurological ROS  negative psych ROS   GI/Hepatic negative GI ROS, Neg liver ROS,   Endo/Other  negative endocrine ROS  Renal/GU negative Renal ROS  negative genitourinary   Musculoskeletal   Abdominal   Peds  Hematology negative hematology ROS (+)   Anesthesia Other Findings )  Reproductive/Obstetrics                             Anesthesia Physical Anesthesia Plan  ASA: II  Anesthesia Plan: General   Post-op Pain Management:    Induction: Intravenous  Airway Management Planned: Oral ETT  Additional Equipment:   Intra-op Plan:   Post-operative Plan: Extubation in OR  Informed Consent: I have reviewed the patients History and Physical, chart, labs and discussed the procedure including the risks, benefits and alternatives for the proposed anesthesia with the patient or authorized representative who has indicated his/her understanding and acceptance.     Plan Discussed with:   Anesthesia Plan Comments:         Anesthesia Quick Evaluation  

## 2016-09-27 NOTE — Brief Op Note (Signed)
09/27/2016  9:12 AM  PATIENT:  Norma Vaughn  31 y.o. female  PRE-OPERATIVE DIAGNOSIS:  Abdominal/pelvic Pain Dyspareunia  POST-OPERATIVE DIAGNOSIS:  Abdominal/pelvic Pain Dyspareunia  PROCEDURE:  Procedure(s): LAPAROSCOPY DIAGNOSTIC (N/A) LAPAROSCOPIC SALPINGO OOPHORECTOMY (Right) LYSIS OF ADHESION (N/A)  SURGEON:  Surgeon(s) and Role:    * Tilda Burrow, MD - Primary  PHYSICIAN ASSISTANT:   ASSISTANTS: Trenton Founds CST   ANESTHESIA:   local and general  EBL:  Total I/O In: 1000 [I.V.:1000] Out: 40 [Urine:30; Blood:10]  BLOOD ADMINISTERED:none  DRAINS: none   LOCAL MEDICATIONS USED:  MARCAINE    and Amount: 20 ml  SPECIMEN:  Source of Specimen:  Right tube and ovary  DISPOSITION OF SPECIMEN:  PATHOLOGY  COUNTS:  YES  TOURNIQUET:  * No tourniquets in log *  DICTATION: .Dragon Dictation  PLAN OF CARE: Discharge to home after PACU  PATIENT DISPOSITION:  PACU - hemodynamically stable.   Delay start of Pharmacological VTE agent (>24hrs) due to surgical blood loss or risk of bleeding: not applicable Details of procedure: Patient was taken operating room prepped and draped for abdominal procedure with legs in low lithotomy support and Foley catheter inserted for bladder drainage. Sponge stick was placed in the vagina after prepping and draping. Type and timeout. Timeout confirmed surgical procedure. Ancef was administered. The was identified and an infraumbilical vertical 1 cm skin incision made as well as a 2 cm transverse suprapubic incision at the old C-section site. Veress needle was introduced through the umbilicus, water droplet technique used to confirm intraperitoneal position, and to name achieved under 6 L CO2 flow, 10 mm maximum pressure achieved and 3 L CO2 introduced into the abdomen trocar was inserted into the umbilicus under laparoscopic direct visualization and the abdomen visualized. There was some omental adhesions around the umbilicus. The  bowel was well out of the way. Suprapubic trocar was placed under direct visualization after the fascia was scored for with 2 and 1/half centimeters and, the right lower quadrant trocar was then inserted A third incision was made in the right lower quadrant and 5 mm trochars subsequently placed there. Attention was directed the pelvis. With the patient in Trendelenburg position the pelvis could be inspected. There were thin adhesions from the sigmoid colon to the right lower quadrant that were primarily involving the epiploic fat. Sharp dissection with harmonic scalpel and traction and countertraction freed up that the left ovary could be visualized and was found not to be attached to the pelvic sidewall but the well away from the vaginal cuff. The right side showed the right ovary was very mobile had a very elongate Orting ligament and was lying in the cul-de-sac, thus the source of the dyspareunia. Recurrent be elevated up and the ureter visualized as well out of the surgical area retroperitoneum. Ovary was and tube were placed on countertraction as per patient request and then Harmonic Ace 7 device used to coagulate and transect the right IP ligament remaining close to the ovary and well away from the pelvic sidewall. In and in a hemostatic fashion with no bleeding encountered Endo Catch bag was placed through the suprapubic port and the specimen placed in the Endo Catch bag and extracted through the fascial incision. Difficulty. Pelvis was irrigated and some saline left in the abdomen just with evacuation of carbon dioxide. Scopic equipment was removed and abdomen relaxed and patient placed out of Trendelenburg. Fascia was closed at the suprapubic site using retractors for visualization Allis clamps to grasp the  fascia and interrupted sutures to reapproximate the fascial edges. In the umbilicus site and the S retractors allowed us to visualize the fascia and a single stitch of 0 Vicryl was placed in the  fascia. And under direct visualization. Moderate trocar site and the tubes suprapubic and umbilical sites were then closed subcuticular with 4-0 Vicryl and Steri-Strips and Band-Aids applied. Taken out. Prior to closure of the skin, the fascia and the skin at all 3 puncture sites was infiltrated with Marcaine solution, wherever there was a suture placed sponge and needle counts were correct patient to recovery in stable condition.

## 2016-09-28 ENCOUNTER — Telehealth: Payer: Self-pay | Admitting: *Deleted

## 2016-09-28 ENCOUNTER — Encounter (HOSPITAL_COMMUNITY): Payer: Self-pay | Admitting: Obstetrics and Gynecology

## 2016-09-28 NOTE — Telephone Encounter (Signed)
Patient called with concerns on how to take care of incision. Advised patient to take a shower and remove outer bandage. To no scrub the area but to let the soapy water run over the incision, to pat dry and to use a pad to cover incision to prevent irritation from underwear. Pt also voiced concerns of not passing gas. Encouraged patient to not drink from straws, to try simethicone, and to walk when able. Pt verbalized understanding.

## 2016-10-04 ENCOUNTER — Telehealth: Payer: Self-pay | Admitting: *Deleted

## 2016-10-04 NOTE — Telephone Encounter (Signed)
Patient called with complaints of yeast infection. Has not tried anything over the counter. Advised patient to try Monistat over the counter and to increase yogurt intake if possible. Pt has appointment scheduled for Friday. Pt verbalized understanding.

## 2016-10-05 ENCOUNTER — Encounter: Payer: Medicaid Other | Admitting: Obstetrics and Gynecology

## 2016-10-07 ENCOUNTER — Encounter: Payer: Self-pay | Admitting: Obstetrics and Gynecology

## 2016-10-07 ENCOUNTER — Ambulatory Visit (INDEPENDENT_AMBULATORY_CARE_PROVIDER_SITE_OTHER): Payer: Medicaid Other | Admitting: Obstetrics and Gynecology

## 2016-10-07 DIAGNOSIS — Z9889 Other specified postprocedural states: Secondary | ICD-10-CM

## 2016-10-07 DIAGNOSIS — N941 Unspecified dyspareunia: Secondary | ICD-10-CM

## 2016-10-07 NOTE — Progress Notes (Signed)
   Subjective:  Nelda BucksDanielle Blattner is a 31 y.o. female now  10 days status post laparoscopic RIGHT salpingectomy oophorectomy and lysis of adhesions .     Review of Systems Negative    Diet:   negative   Bowel movements : normal.  The patient is not having any pain.  Objective:  BP 92/70 (BP Location: Right Arm, Patient Position: Sitting, Cuff Size: Normal)   Pulse 80   Wt 173 lb 12.8 oz (78.8 kg)   LMP 06/18/2015   BMI 31.79 kg/m  General:Well developed, well nourished.  No acute distress. Abdomen: Bowel sounds normal, soft, non-tender. Pelvic Exam: not indicated   Incision(s):   Healing well, no drainage, no erythema, no hernia, no swelling, no dehiscence,     Assessment:  Post-Op  10 days s/p laparoscopic RIGHT salpingectomy oophorectomy and lysis of adhesions     Doing well postoperatively.   Plan:  1.Wound care discussed   2. . current medications. 3. Activity restrictions: none 4. return to work: not applicable. 5. Follow up in 1 year  By signing my name below, I, Sonum Patel, attest that this documentation has been prepared under the direction and in the presence of Tilda BurrowJohn Adrien Dietzman V, MD. Electronically Signed: Sonum Patel, Neurosurgeoncribe. 10/07/16. 1:35 PM.  I personally performed the services described in this documentation, which was SCRIBED in my presence. The recorded information has been reviewed and considered accurate. It has been edited as necessary during review. Tilda BurrowFERGUSON,Sharhonda Atwood V, MD

## 2017-04-27 ENCOUNTER — Ambulatory Visit: Payer: Medicaid Other | Admitting: Women's Health

## 2017-05-03 ENCOUNTER — Ambulatory Visit (INDEPENDENT_AMBULATORY_CARE_PROVIDER_SITE_OTHER): Payer: Medicaid Other | Admitting: Women's Health

## 2017-05-03 ENCOUNTER — Encounter: Payer: Self-pay | Admitting: Women's Health

## 2017-05-03 VITALS — BP 92/70 | HR 67 | Ht 62.0 in | Wt 165.0 lb

## 2017-05-03 DIAGNOSIS — Z803 Family history of malignant neoplasm of breast: Secondary | ICD-10-CM | POA: Diagnosis not present

## 2017-05-03 DIAGNOSIS — N644 Mastodynia: Secondary | ICD-10-CM

## 2017-05-03 NOTE — Progress Notes (Signed)
   [facility]  Patient name: Norma Vaughn MRN [id number]  Date of birth: 06-04-1986  CC & HPI:  Norma Vaughn is a 31 y.o. [phone] Caucasian female presenting today for constant sharp Rt breast pain x 3 months. Began right after her bday in June. Has to take ibuprofen to help. Hasn't noticed/felt any changes in breasts. Strong family h/o breast cancer. Her mom was dx w/ breast cancer @ 28yo, then again @ 30yo, two maternal aunts, maternal grandmother, paternal grandmother, and paternal aunt. Never had any genetic testing. Isn't interested right now.  Patient's last menstrual period was 06/18/2015. The current method of family planning is status post hysterectomy. Last pap 2015, normal  Pertinent History Reviewed:  Medical & Surgical Hx:   Past medical, surgical, family, and social history reviewed in electronic medical record Medications: Reviewed & Updated - see associated section Allergies: Reviewed in electronic medical record  Objective Findings:  Vitals: BP 92/70 (BP Location: Left Arm, Patient Position: Sitting, Cuff Size: Normal)   Pulse 67   Ht 5\' 2"  (1.575 m)   Wt 165 lb (74.8 kg)   LMP 06/18/2015   BMI 30.18 kg/m  Body mass index is 30.18 kg/m.  Physical Examination: General appearance - alert, well appearing, and in no distress Breasts - breasts appear normal, no suspicious masses, no skin or nipple changes or axillary nodes + tenderness Rt breast from 3-6 o'clock from nipple to outer breast  No results found for this or any previous visit (from the past 24 hour(s)).   Assessment & Plan:  A:   Constant Rt breast pain  Strong family h/o breast cancer  P:  Bilateral breast u/s and mammogram @ [facility] 9/11 @ 1000, be there at 0915, no lotion/powder/deoderant/perfume that am  Let me know if changes mind about genetic counseling/testing  Return in about 4 weeks (around 05/31/2017) for Physical.  Marge DuncansBooker, Dhalia Zingaro Randall CNM,  [facility]NP-BC 05/03/2017 11:46 AM

## 2017-05-03 NOTE — Patient Instructions (Signed)
Breast [facility] and mamm[facility] @ 10:00[facility], be there at 9:45am, no lotion/deoderant/powder/perfume that day   Let me know if you decide you want genetic counseling to discuss testing due to strong family history of breast cancer

## 2017-05-09 ENCOUNTER — Ambulatory Visit (HOSPITAL_COMMUNITY)
Admission: RE | Admit: 2017-05-09 | Discharge: 2017-05-09 | Disposition: A | Discharge status: Home / Self Care | Payer: Medicaid Other | Source: Ambulatory Visit | Attending: Women's Health | Admitting: Women's Health

## 2017-05-09 ENCOUNTER — Encounter (HOSPITAL_COMMUNITY): Payer: Self-pay

## 2017-05-09 DIAGNOSIS — Z803 Family history of malignant neoplasm of breast: Secondary | ICD-10-CM

## 2017-05-09 DIAGNOSIS — N644 Mastodynia: Secondary | ICD-10-CM

## 2017-05-11 ENCOUNTER — Telehealth: Payer: Self-pay | Admitting: Women's Health

## 2017-05-11 ENCOUNTER — Other Ambulatory Visit: Payer: Self-pay | Admitting: Women's Health

## 2017-05-11 ENCOUNTER — Encounter: Payer: Self-pay | Admitting: Women's Health

## 2017-05-11 DIAGNOSIS — Z90721 Acquired absence of ovaries, unilateral: Secondary | ICD-10-CM | POA: Insufficient documentation

## 2017-05-11 DIAGNOSIS — Z803 Family history of malignant neoplasm of breast: Secondary | ICD-10-CM

## 2017-05-11 DIAGNOSIS — Z9079 Acquired absence of other genital organ(s): Secondary | ICD-10-CM

## 2017-05-11 DIAGNOSIS — N644 Mastodynia: Secondary | ICD-10-CM

## 2017-05-11 NOTE — Telephone Encounter (Signed)
[facility] d/t strong family h/o breast cancer, pt wants, pre-cert done today. [facility] 5:35[facility], be there at 5:15pm, npo 4hr prior, sips of water up until 1hr prior, no lotion/powder/deoderant/perfume that am, no metal, bring insurance card and ID.  Cheral MarkerKimberly R. Quincy Prisco, CNM, [facility] 05/11/2017 3:31 PM

## 2017-05-25 ENCOUNTER — Inpatient Hospital Stay: Admission: RE | Admit: 2017-05-25 | Payer: Medicaid Other | Source: Ambulatory Visit

## 2017-05-31 ENCOUNTER — Ambulatory Visit: Payer: Medicaid Other | Admitting: Women's Health

## 2017-06-01 ENCOUNTER — Other Ambulatory Visit: Payer: Medicaid Other

## 2017-06-05 ENCOUNTER — Ambulatory Visit
Admission: RE | Admit: 2017-06-05 | Discharge: 2017-06-05 | Disposition: A | Discharge status: Home / Self Care | Payer: Medicaid Other | Source: Ambulatory Visit | Attending: Women's Health | Admitting: Women's Health

## 2017-06-05 ENCOUNTER — Encounter: Payer: Self-pay | Admitting: Women's Health

## 2017-06-05 DIAGNOSIS — N644 Mastodynia: Secondary | ICD-10-CM

## 2017-06-05 DIAGNOSIS — Z803 Family history of malignant neoplasm of breast: Secondary | ICD-10-CM

## 2017-06-05 MED ORDER — GADOBENATE DIMEGLUMINE 529 MG/ML IV SOLN: 15.0000 mL | Freq: Once | INTRAVENOUS | Status: DC | PRN

## 2017-06-12 ENCOUNTER — Ambulatory Visit: Payer: Medicaid Other | Admitting: Women's Health

## 2017-06-15 ENCOUNTER — Ambulatory Visit: Payer: Medicaid Other | Admitting: Women's Health

## 2017-08-10 ENCOUNTER — Encounter: Payer: Self-pay | Admitting: Women's Health

## 2017-08-11 ENCOUNTER — Encounter: Payer: Self-pay | Admitting: Genetics

## 2017-08-11 DIAGNOSIS — Z8041 Family history of malignant neoplasm of ovary: Secondary | ICD-10-CM

## 2017-08-11 HISTORY — DX: Family history of malignant neoplasm of ovary: Z80.41

## 2017-08-11 NOTE — Progress Notes (Deleted)
REFERRING PROVIDER: Roma Schanz, CNM [address], [place] [address]  PRIMARY PROVIDER:  Patient, No Pcp Per  PRIMARY REASON FOR VISIT:  1. Family history of breast cancer   2. Family history of ovarian cancer      HISTORY OF PRESENT ILLNESS:   Ms. Towe, a 31 y.o. female, was seen for a [place] cancer genetics consultation at the request of Dr. Gertie Exon due to a family history of breast and ovarian cancer.  Ms. Bores presents to clinic today to discuss the possibility of a hereditary predisposition to cancer, genetic testing, and to further clarify her future cancer risks, as well as potential cancer risks for family members.   Ms. Firman is a 31 yo female with no prior history of cancer.    HORMONAL RISK FACTORS:  Menarche was at age ***.  First live birth at age ***.  OCP use for approximately {Numbers 1-12 multi-select:20307} years.  Ovaries intact: {Yes/No-Ex:120004}.  Hysterectomy: {Yes/No-Ex:120004}.  Menopausal status: {Menopause:31378}.  HRT use: {Numbers 1-12 multi-select:20307} years. Colonoscopy: {Yes/No-Ex:120004}; {normal/abnormal/not examined:14677}. Mammogram within the last year: yes. Number of breast biopsies: {Numbers 1-12 multi-select:20307}. Up to date with pelvic exams:  {Yes/No-Ex:120004}.  Past Medical History:  Diagnosis Date  . Anemia   . Contraceptive management 11/07/2013  . Dysmenorrhea 11/25/2014  . Family history of ovarian cancer 08/11/2017  . Glaucoma   . Irregular intermenstrual bleeding 03/11/2015  . Menorrhagia with regular cycle 11/25/2014  . Migraines   . No pertinent past medical history   . Pelvic pain in female 03/11/2015  . PID (pelvic inflammatory disease) 03/11/2015    Past Surgical History:  Procedure Laterality Date  . CESAREAN SECTION  07/31/2012   Procedure: CESAREAN SECTION;  Surgeon: Lavonia Drafts, MD;  Location: [facility];  Service: Gynecology;  Laterality: N/A;  primary  .  DILATION AND CURETTAGE OF UTERUS    . LAPAROSCOPIC SALPINGO OOPHERECTOMY Right 09/27/2016   Procedure: LAPAROSCOPIC SALPINGO OOPHORECTOMY;  Surgeon: Jonnie Kind, MD;  Location: [facility];  Service: Gynecology;  Laterality: Right;  . LAPAROSCOPY N/A 09/27/2016   Procedure: LAPAROSCOPY DIAGNOSTIC;  Surgeon: Jonnie Kind, MD;  Location: [facility];  Service: Gynecology;  Laterality: N/A;  . LYSIS OF ADHESION N/A 09/27/2016   Procedure: LYSIS OF ADHESION;  Surgeon: Jonnie Kind, MD;  Location: [facility];  Service: Gynecology;  Laterality: N/A;  . RECTOCELE REPAIR N/A 07/21/2015   Procedure: POSTERIOR REPAIR (RECTOCELE);  Surgeon: Jonnie Kind, MD;  Location: [facility];  Service: Gynecology;  Laterality: N/A;  . VAGINAL HYSTERECTOMY N/A 07/21/2015   Procedure: HYSTERECTOMY VAGINAL;  Surgeon: Jonnie Kind, MD;  Location: [facility];  Service: Gynecology;  Laterality: N/A;    Social History   Socioeconomic History  . Marital status: Married    Spouse name: Not on file  . Number of children: Not on file  . Years of education: Not on file  . Highest education level: Not on file  Social Needs  . Financial resource strain: Not on file  . Food insecurity - worry: Not on file  . Food insecurity - inability: Not on file  . Transportation needs - medical: Not on file  . Transportation needs - non-medical: Not on file  Occupational History  . Not on file  Tobacco Use  . Smoking status: Former Smoker    Packs/day: 0.50    Years: 10.00    Pack years: 5.00    Types: Cigarettes    Last attempt  to quit: 03/30/2005    Years since quitting: 12.3  . Smokeless tobacco: Never Used  Substance and Sexual Activity  . Alcohol use: No  . Drug use: No  . Sexual activity: Yes    Birth control/protection: Surgical    Comment: hyst  Other Topics Concern  . Not on file  Social History Narrative  . Not on file     FAMILY HISTORY:  We obtained a detailed, 4-generation family history.  Significant diagnoses  are listed below:  ***  Ms. Cumbee is {aware/unaware} of previous family history of genetic testing for hereditary cancer risks. Patient's maternal ancestors are of *** descent, and paternal ancestors are of *** descent. There {IS NO:12509} reported Ashkenazi Jewish ancestry. There {IS NO:12509} known consanguinity.  GENETIC COUNSELING ASSESSMENT: Wania Longstreth is a 31 y.o. female with a family history which is somewhat suggestive of a Hereditary Cancer Predisposition Syndrome. We, therefore, discussed and recommended the following at today's visit.   DISCUSSION: We reviewed the characteristics, features and inheritance patterns of hereditary cancer syndromes. We also discussed genetic testing, including the appropriate family members to test, the process of testing, insurance coverage and turn-around-time for results. We discussed the implications of a negative, positive and/or variant of uncertain significant result. We recommended Ms. Kretzschmar pursue genetic testing for the *** gene panel.   We discussed that only 5-10% of cancers are associated with a Hereditary cancer predisposition syndrome.  One of the most common hereditary cancer syndromes that increases breast cancer risk is called Hereditary Breast and Ovarian Cancer (HBOC) syndrome.  This syndrome is caused by mutations in the BRCA1 and BRCA2 genes.  This syndrome increases an individual's lifetime risk to develop breast, ovarian, pancreatic, and other types of cancer.  There are also many other cancer predisposition syndromes caused by mutations in several other genes.  We discussed that if she is found to have a mutation in one of these genes, alter future medical management recommendations such as increased cancer screenings and consideration of risk reducing surgeries.  A positive result could also have implications for the patient's family members.  A Negative result would mean we were unable to identify a hereditary  predisposition to cancer, but does not rule out the possibility of a basis for her family's cancer.  There could be mutations that are undetectable by current technology, or in genes not yet tested or identified to increase cancer risk.   There could also be a hereditary cause for the cancer in her family that she did not inherit.   We discussed the potential to find a Variant of Uncertain Significance or VUS.  These are variants that have not yet been identified as pathogenic or benign, and it is unknown if this variant is associated with increased cancer risk or if this is a normal finding.  Most VUS's are reclassified to benign or likely benign.   It should not be used to make medical management decisions. With time, we suspect the lab will determine the significance of any VUS's identified if any.   Based on Ms. Mosher's family history of cancer, she meets medical criteria for genetic testing. Despite that she meets criteria, she may still have an out of pocket cost. We discussed that if her out of pocket cost for testing is over $100, the laboratory will call and confirm whether she wants to proceed with testing.  If the out of pocket cost of testing is less than $100 she will be billed by the genetic  testing laboratory.   ***Based on the patient's personal and family history, the statistical model Tyrer Cusik sas used to estimate her of developing breast cancer. This estimates her lifetime risk of developing breast cancer to be approximately ***. This estimation does not take into account any genetic testing results.  The patient's lifetime breast cancer risk is a preliminary estimate based on available information using one of several models endorsed by the [employer] (ACS). The ACS recommends consideration of breast MRI screening as an adjunct to mammography for patients at high risk (defined as 20% or greater lifetime risk). A more detailed breast cancer risk assessment can be  considered, if clinically indicated.   *** Ms. Satterwhite has been determined to be at high risk for breast cancer.  Therefore, we recommend that annual screening with mammography and breast MRI begin at age 58, or 10 years prior to the age of breast cancer diagnosis in a relative (whichever is earlier).  We discussed that Ms. Caudill should discuss her individual situation with her referring physician and determine a breast cancer screening plan with which they are both comfortable.     PLAN: After considering the risks, benefits, and limitations, Ms. Caban  provided informed consent to pursue genetic testing and the blood sample was sent to [facility] for analysis of the {test}. Results should be available within approximately 2-3 weeks' time, at which point they will be disclosed by telephone to Ms. Petrucci, as will any additional recommendations warranted by these results. Ms. Balestrieri will receive a summary of her genetic counseling visit and a copy of her results once available. This information will also be available in Epic. We encouraged Ms. Kruck to remain in contact with cancer genetics annually so that we can continuously update the family history and inform her of any changes in cancer genetics and testing that may be of benefit for her family. Ms. Mcevers questions were answered to her satisfaction today. Our contact information was provided should additional questions or concerns arise.  *** Despite our recommendation, Ms. Almgren did not wish to pursue genetic testing at today's visit. We understand this decision, and remain available to coordinate genetic testing at any time in the future. We; therefore, recommend Ms. Seith continue to follow the cancer screening guidelines given by her primary healthcare provider.  Based on Ms. Perlman's family history, we recommended her maternal relatives especially those affected with cancer, have genetic counseling and  testing. Ms. Fairbairn will let us know if we can be of any assistance in coordinating genetic counseling and/or testing for this family member.   Lastly, we encouraged Ms. Dunshee to remain in contact with cancer genetics annually so that we can continuously update the family history and inform her of any changes in cancer genetics and testing that may be of benefit for this family.   Ms.  Canady questions were answered to her satisfaction today. Our contact information was provided should additional questions or concerns arise. Thank you for the referral and allowing [place] to share in the care of your patient.   Tana Felts, MS Genetic Counselor [email or web address] phone: [phone]  The patient was seen for a total of *** minutes in face-to-face genetic counseling.  The patient was accompanied today by her ***

## 2017-08-14 ENCOUNTER — Encounter: Payer: Medicaid Other | Admitting: Genetics

## 2017-08-14 ENCOUNTER — Other Ambulatory Visit: Payer: Medicaid Other

## 2017-09-18 ENCOUNTER — Encounter: Payer: Medicaid Other | Admitting: Genetics

## 2017-09-25 ENCOUNTER — Encounter: Payer: Medicaid Other | Admitting: Genetic Counselor

## 2017-09-25 ENCOUNTER — Encounter: Payer: Self-pay | Admitting: Women's Health

## 2017-09-27 ENCOUNTER — Encounter: Payer: Self-pay | Admitting: Women's Health

## 2017-10-03 ENCOUNTER — Ambulatory Visit: Payer: Medicaid Other | Admitting: Women's Health

## 2017-10-17 ENCOUNTER — Ambulatory Visit: Payer: Self-pay | Admitting: Women's Health

## 2017-10-26 ENCOUNTER — Ambulatory Visit: Payer: Self-pay | Admitting: Women's Health

## 2019-01-26 IMAGING — MG 2D DIGITAL DIAGNOSTIC BILATERAL MAMMOGRAM WITH CAD AND ADJUNCT T
8 series · 8 of 24 positions shown · non-contrast
Comparison: None.

CLINICAL DATA: 31-year-old female with nonfocal pain throughout the
lower right breast. Patient reports a strong family history of
breast cancer with her mother diagnosed with breast cancer at age
28.

EXAM:
2D DIGITAL DIAGNOSTIC BILATERAL MAMMOGRAM WITH CAD AND ADJUNCT TOMO

[R CC]
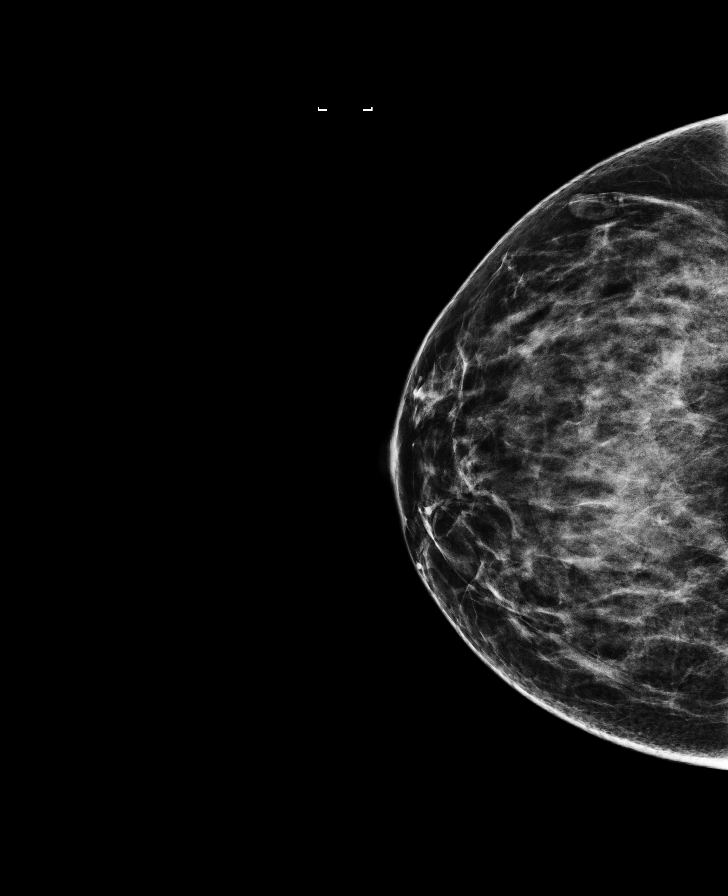

[L CC]
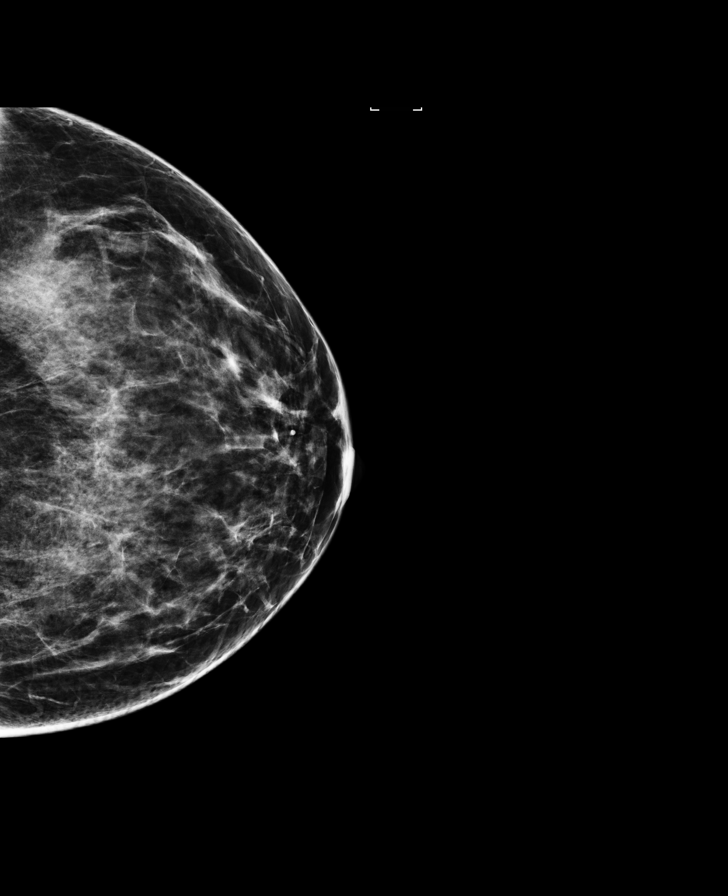

[L MLO]
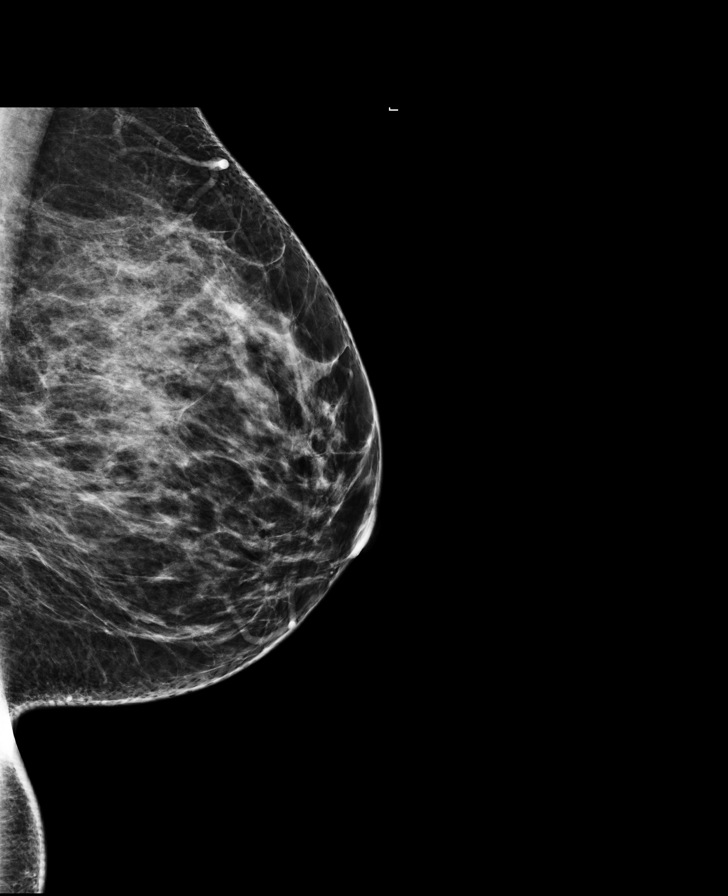

[R MLO]
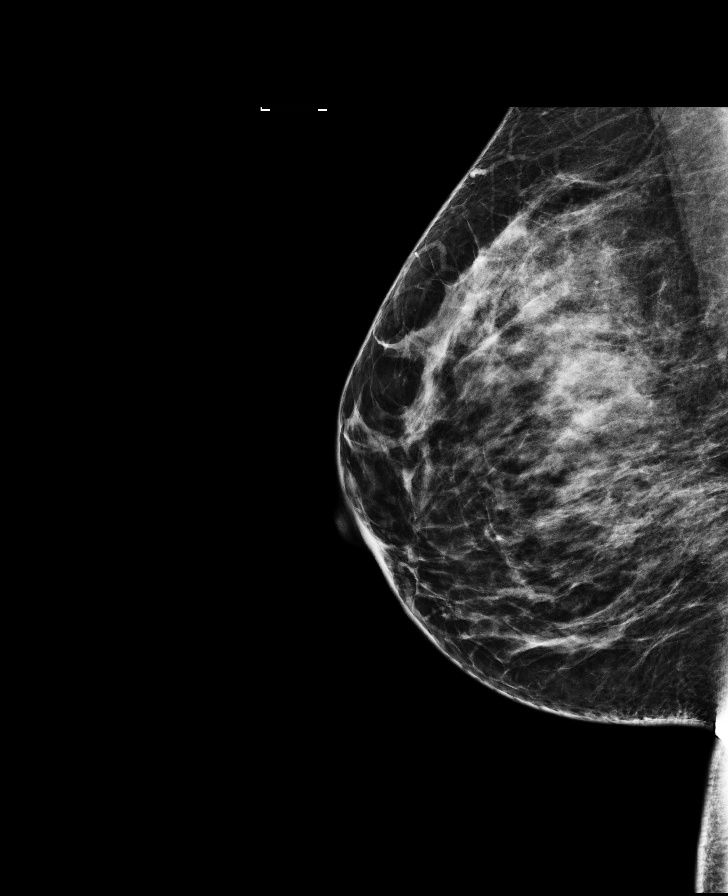

[L MLO tomo · tomo slice 33/66.0]
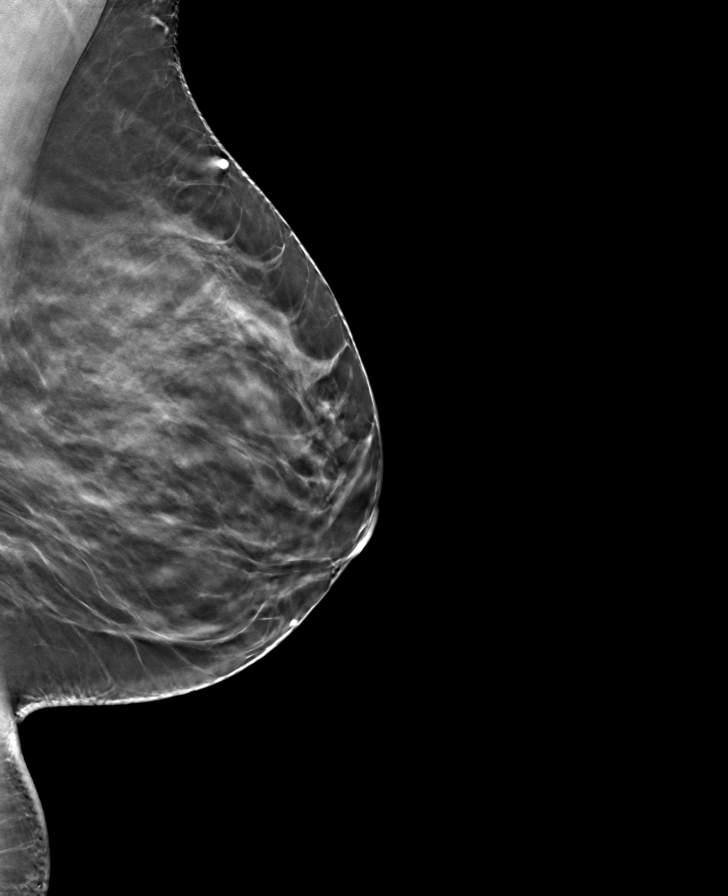

[R MLO tomo · tomo slice 35/70.0]
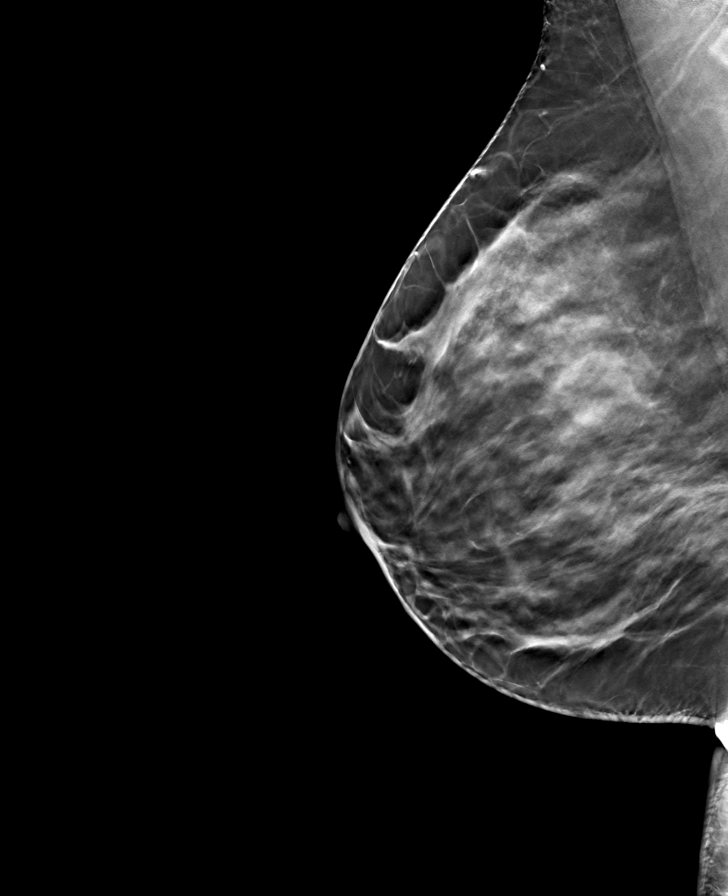

[L CC tomo · tomo slice 33/65.0]
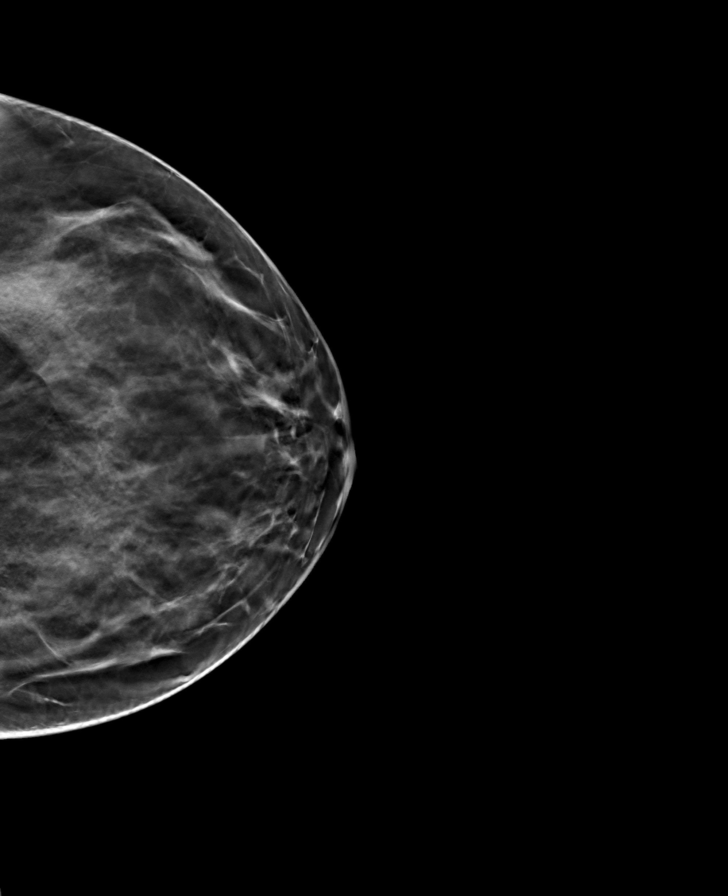

[R CC tomo · tomo slice 31/61.0]
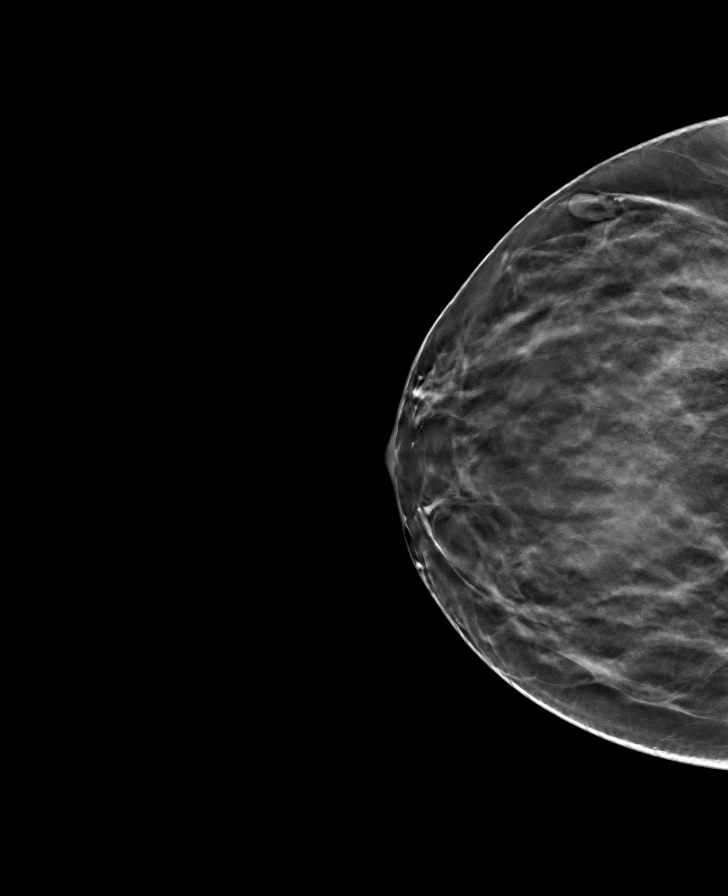

[8 of 24 positions shown; findings below may reference images not displayed]

ACR Breast Density Category c: The breast tissue is heterogeneously
dense, which may obscure small masses.
FINDINGS: No suspicious masses or calcifications are seen in either breast.
There is no mammographic evidence of malignancy in either breast.

Mammographic images were processed with CAD.
IMPRESSION: No mammographic evidence of malignancy in either breast.

RECOMMENDATION:
1. Recommend further management of the nonfocal right breast pain be
based on clinical assessment.

2. The patient reports a strong family history of breast cancer with
her mother diagnosed with breast cancer at age 28 and therefore
annual routine screening mammography is recommended. The American
Cancer Society recommends annual MRI and mammography in patients
with an estimated lifetime risk of developing breast cancer greater
than 20 - 25%, or who are known or suspected to be positive for the
breast cancer gene.

I have discussed the findings and recommendations with the patient.
Results were also provided in writing at the conclusion of the
visit. If applicable, a reminder letter will be sent to the patient
regarding the next appointment.

BI-RADS CATEGORY  1: Negative.

## 2021-02-15 ENCOUNTER — Other Ambulatory Visit: Payer: Medicaid - Out of State | Admitting: Women's Health

## 2021-03-04 ENCOUNTER — Other Ambulatory Visit: Payer: Medicaid - Out of State | Admitting: Women's Health

## 2021-03-31 ENCOUNTER — Other Ambulatory Visit: Payer: Medicaid - Out of State | Admitting: Women's Health
